# Patient Record
Sex: Female | Born: 1950 | Race: White | Hispanic: Yes | Marital: Married | Smoking: Current every day smoker
Health system: Southern US, Community
[De-identification: ages and names within clinical notes are randomized; demographics above are authoritative.]

## PROBLEM LIST (undated history)

## (undated) DIAGNOSIS — F32A Depression, unspecified: Secondary | ICD-10-CM

## (undated) DIAGNOSIS — T7840XA Allergy, unspecified, initial encounter: Secondary | ICD-10-CM

## (undated) DIAGNOSIS — K219 Gastro-esophageal reflux disease without esophagitis: Secondary | ICD-10-CM

## (undated) DIAGNOSIS — R7303 Prediabetes: Secondary | ICD-10-CM

## (undated) DIAGNOSIS — Z8619 Personal history of other infectious and parasitic diseases: Secondary | ICD-10-CM

## (undated) DIAGNOSIS — J302 Other seasonal allergic rhinitis: Secondary | ICD-10-CM

## (undated) DIAGNOSIS — IMO0001 Reserved for inherently not codable concepts without codable children: Secondary | ICD-10-CM

## (undated) DIAGNOSIS — F17200 Nicotine dependence, unspecified, uncomplicated: Secondary | ICD-10-CM

## (undated) DIAGNOSIS — I10 Essential (primary) hypertension: Secondary | ICD-10-CM

## (undated) DIAGNOSIS — B019 Varicella without complication: Secondary | ICD-10-CM

## (undated) DIAGNOSIS — F329 Major depressive disorder, single episode, unspecified: Secondary | ICD-10-CM

## (undated) DIAGNOSIS — Z8701 Personal history of pneumonia (recurrent): Secondary | ICD-10-CM

## (undated) HISTORY — PX: COLONOSCOPY: SHX174

## (undated) HISTORY — DX: Nicotine dependence, unspecified, uncomplicated: F17.200

## (undated) HISTORY — DX: Varicella without complication: B01.9

## (undated) HISTORY — DX: Other seasonal allergic rhinitis: J30.2

## (undated) HISTORY — DX: Depression, unspecified: F32.A

## (undated) HISTORY — DX: Essential (primary) hypertension: I10

## (undated) HISTORY — DX: Gastro-esophageal reflux disease without esophagitis: K21.9

## (undated) HISTORY — DX: Allergy, unspecified, initial encounter: T78.40XA

## (undated) HISTORY — DX: Personal history of other infectious and parasitic diseases: Z86.19

## (undated) HISTORY — DX: Reserved for inherently not codable concepts without codable children: IMO0001

## (undated) HISTORY — DX: Personal history of pneumonia (recurrent): Z87.01

## (undated) HISTORY — DX: Major depressive disorder, single episode, unspecified: F32.9

## (undated) HISTORY — DX: Prediabetes: R73.03

---

## 2009-05-11 DIAGNOSIS — R7303 Prediabetes: Secondary | ICD-10-CM

## 2009-05-11 HISTORY — DX: Prediabetes: R73.03

## 2009-09-05 ENCOUNTER — Ambulatory Visit: Payer: Self-pay | Admitting: Obstetrics and Gynecology

## 2009-11-29 ENCOUNTER — Ambulatory Visit: Payer: Self-pay | Admitting: Family Medicine

## 2010-01-31 LAB — COMPREHENSIVE METABOLIC PANEL
AST: 18 U/L
Albumin: 4.2
Calcium: 9.2 mg/dL
Sodium: 140 mmol/L (ref 137–147)
Total Protein: 6.7 g/dL

## 2010-01-31 LAB — HEMOGLOBIN A1C: A1c: 6.5

## 2010-01-31 LAB — LIPID PANEL
Direct LDL: 151
Triglyceride fasting, serum: 106

## 2010-01-31 LAB — VITAMIN B12: Vitamin B12 Deficiency Panel: 561

## 2010-01-31 LAB — FOLATE: Folate: 11.7

## 2010-01-31 LAB — CBC
Hemoglobin: 13.9 g/dL (ref 12.0–16.0)
WBC: 7.7

## 2010-05-12 LAB — HM PAP SMEAR: HM PAP: NORMAL

## 2011-05-12 DIAGNOSIS — Z8701 Personal history of pneumonia (recurrent): Secondary | ICD-10-CM

## 2011-05-12 HISTORY — DX: Personal history of pneumonia (recurrent): Z87.01

## 2011-08-13 ENCOUNTER — Emergency Department: Payer: Self-pay | Admitting: Emergency Medicine

## 2011-08-13 LAB — COMPREHENSIVE METABOLIC PANEL
Alkaline Phosphatase: 91 U/L (ref 50–136)
BUN: 14 mg/dL (ref 7–18)
Bilirubin,Total: 0.3 mg/dL (ref 0.2–1.0)
Calcium, Total: 8.9 mg/dL (ref 8.5–10.1)
Co2: 25 mmol/L (ref 21–32)
EGFR (African American): >60
EGFR (Non-African Amer.): >60
Osmolality: 286 (ref 275–301)
Potassium: 3.8 mmol/L (ref 3.5–5.1)
Total Protein: 7.7 g/dL (ref 6.4–8.2)

## 2011-08-13 LAB — CK TOTAL AND CKMB (NOT AT ARMC): CK-MB: 0.7 ng/mL (ref 0.5–3.6)

## 2011-08-13 LAB — CBC
HCT: 41.9 % (ref 35.0–47.0)
HGB: 14.1 g/dL (ref 12.0–16.0)
MCH: 28.1 pg (ref 26.0–34.0)
MCHC: 33.7 g/dL (ref 32.0–36.0)
MCV: 83 fL (ref 80–100)
Platelet: 250 10*3/uL (ref 150–440)
RBC: 5.03 10*6/uL (ref 3.80–5.20)

## 2011-09-16 ENCOUNTER — Encounter: Payer: Self-pay | Admitting: Family Medicine

## 2011-09-16 ENCOUNTER — Ambulatory Visit (INDEPENDENT_AMBULATORY_CARE_PROVIDER_SITE_OTHER): Payer: Managed Care, Other (non HMO) | Admitting: Family Medicine

## 2011-09-16 VITALS — BP 118/76 | HR 74 | Temp 97.3°F | Ht 64.0 in | Wt 153.0 lb

## 2011-09-16 DIAGNOSIS — Z780 Asymptomatic menopausal state: Secondary | ICD-10-CM

## 2011-09-16 DIAGNOSIS — IMO0001 Reserved for inherently not codable concepts without codable children: Secondary | ICD-10-CM

## 2011-09-16 DIAGNOSIS — F17200 Nicotine dependence, unspecified, uncomplicated: Secondary | ICD-10-CM

## 2011-09-16 DIAGNOSIS — Z23 Encounter for immunization: Secondary | ICD-10-CM

## 2011-09-16 DIAGNOSIS — E785 Hyperlipidemia, unspecified: Secondary | ICD-10-CM

## 2011-09-16 DIAGNOSIS — F17210 Nicotine dependence, cigarettes, uncomplicated: Secondary | ICD-10-CM | POA: Insufficient documentation

## 2011-09-16 DIAGNOSIS — J302 Other seasonal allergic rhinitis: Secondary | ICD-10-CM

## 2011-09-16 DIAGNOSIS — R51 Headache: Secondary | ICD-10-CM

## 2011-09-16 DIAGNOSIS — Z8701 Personal history of pneumonia (recurrent): Secondary | ICD-10-CM

## 2011-09-16 DIAGNOSIS — J309 Allergic rhinitis, unspecified: Secondary | ICD-10-CM

## 2011-09-16 DIAGNOSIS — R519 Headache, unspecified: Secondary | ICD-10-CM

## 2011-09-16 DIAGNOSIS — K219 Gastro-esophageal reflux disease without esophagitis: Secondary | ICD-10-CM

## 2011-09-16 MED ORDER — OMEPRAZOLE 40 MG PO CPDR: 40.0000 mg | DELAYED_RELEASE_CAPSULE | Freq: Every day | ORAL | Status: DC

## 2011-09-16 NOTE — Assessment & Plan Note (Signed)
Check FLP when returns fasting.  Requested records from prior PCP.

## 2011-09-16 NOTE — Progress Notes (Signed)
Subjective:    Patient ID: Robin Becker, female    DOB: 1950/11/08, 61 y.o.   MRN: [id number]  HPI CC: new pt establish  presents to establish care, looking for MD closer to home, prior saw Dr. Quillian Quince at [facility].  Have requested records from him.  On prempro for last 3-4 yrs.  Menopause age 59yo.  States takes prempro because of prior hormone imbalance that would cause episodes where didn't remember periods of the day.  Since starting this med, those sxs have resolved.  Takes prempro 2-3 x/wk.  States also had ?ablation procedure by OBGYN for thickened endometrium.  No bleeding currently.  H/o hemorrhage after stopping prempro.  No recent vision exam - rec schedule.  H/o glaucoma s/p surgery remotely.  GERD - prior on nexium, ran out 03/2011.  Finds GERD sxs returning.  Smokes a few cigarettes a day, more when drinking with friends.  Caffeine: 2 cups coffee/day Lives with husband and son and step son Occupation: homemaker Activity: wants to restart gym.  No regular activity Diet: good water, vegetables daily  Preventative: Last CPE 05/2010. Well woman - [facility] 2 yrs ago.  Had pap and states normal.   Mammogram - none recently - last was 3 yrs ago ~2010 Unsure tetanus.  Never colonoscopy.  Medications and allergies reviewed and updated in chart.  Past histories reviewed and updated if relevant as below. There is no problem list on file for this patient.  Past Medical History  Diagnosis Date  . History of chicken pox   . Depression   . Generalized headaches     frequent  . GERD (gastroesophageal reflux disease)   . Glaucoma     s/p surgery  . Seasonal allergies   . HLD (hyperlipidemia)   . Smoking    Past Surgical History  Procedure Date  . Cesarean section 1977   History  Substance Use Topics  . Smoking status: Current Everyday Smoker -- 0.3 packs/day    Types: Cigarettes  . Smokeless tobacco: Never Used  . Alcohol Use: Yes     Regular on weekends    Family History  Problem Relation Age of Onset  . Cancer Sister 73    colon and otherwise  . Stroke Maternal Aunt   . Diabetes Neg Hx   . Coronary artery disease Cousin    No Known Allergies Current Outpatient Prescriptions on File Prior to Visit  Medication Sig Dispense Refill  . estrogen, conjugated,-medroxyprogesterone (PREMPRO) 0.3-1.5 MG per tablet Take 1 tablet by mouth 2 (two) times a week.      Marland Kitchen omeprazole (PRILOSEC) 40 MG capsule Take 1 capsule (40 mg total) by mouth daily.  30 capsule  3     Review of Systems  Constitutional: Negative for fever, chills, activity change, appetite change, fatigue and unexpected weight change.  HENT: Negative for hearing loss and neck pain.   Eyes: Negative for visual disturbance.  Respiratory: Negative for cough, chest tightness, shortness of breath and wheezing.   Cardiovascular: Negative for chest pain, palpitations and leg swelling.  Gastrointestinal: Positive for abdominal pain. Negative for nausea, vomiting, diarrhea, constipation, blood in stool and abdominal distention.  Genitourinary: Negative for hematuria and difficulty urinating.  Musculoskeletal: Negative for myalgias and arthralgias.  Skin: Negative for rash.  Neurological: Positive for headaches. Negative for dizziness, seizures and syncope.  Hematological: Does not bruise/bleed easily.  Psychiatric/Behavioral: Negative for dysphoric mood. The patient is nervous/anxious.        Objective:   Physical  Exam  Nursing note and vitals reviewed. Constitutional: She is oriented to person, place, and time. She appears well-developed and well-nourished. No distress.  HENT:  Head: Normocephalic and atraumatic.  Right Ear: Hearing, tympanic membrane, external ear and ear canal normal.  Left Ear: Hearing, tympanic membrane, external ear and ear canal normal.  Nose: Nose normal.  Mouth/Throat: Oropharynx is clear and moist. No oropharyngeal exudate.  Eyes: Conjunctivae and EOM are  normal. Pupils are equal, round, and reactive to light. No scleral icterus.  Neck: Normal range of motion. Neck supple. Carotid bruit is not present. No thyromegaly present.  Cardiovascular: Normal rate, regular rhythm, normal heart sounds and intact distal pulses.   No murmur heard. Pulses:      Radial pulses are 2+ on the right side, and 2+ on the left side.  Pulmonary/Chest: Effort normal and breath sounds normal. No respiratory distress. She has no wheezes. She has no rales.  Abdominal: Soft. Bowel sounds are normal. She exhibits no distension and no mass. There is no tenderness. There is no rebound and no guarding.  Musculoskeletal: Normal range of motion. She exhibits no edema.  Lymphadenopathy:    She has no cervical adenopathy.  Neurological: She is alert and oriented to person, place, and time.       CN grossly intact, station and gait intact  Skin: Skin is warm and dry. No rash noted.  Psychiatric: She has a normal mood and affect. Her behavior is normal. Judgment and thought content normal.      Assessment & Plan:

## 2011-09-16 NOTE — Assessment & Plan Note (Signed)
On prempro low dose 2-3x/wk. Discussed concern with this, esp if h/o endometrial hyperplasia?? States knows risk of hormonal therapy and cancers.  Also discussed cardiovascular risk and risk of blood clots. Advised to schedule well woman with OBGYN and that I recommended yearly pelvic exams and for OBGYN to follow hormones. Pt states she will make appt with OBGYN.

## 2011-09-16 NOTE — Assessment & Plan Note (Signed)
Encouraged cessation , discussed this is best way of preventing URTIs and PNAs.

## 2011-09-16 NOTE — Assessment & Plan Note (Signed)
nexium worked well in past, but too expensive. Will try omeprazole at 40mg  daily - states breakthrough sxs with OTC 20mg  prilosec. Forgot to provide with samples but will have Kim call to pick up.

## 2011-09-16 NOTE — Assessment & Plan Note (Signed)
Pneumovax today, rpt due 2018 at age 61yo.

## 2011-09-16 NOTE — Patient Instructions (Addendum)
Great to meet you today - gusto conocerla hoy. Llamenos con preguntas. Omeprazole 40mg  para reflujo. Haga cita con [facility] para examen de mujer - yo recomiendo annual porque esta en hormonas. Pneumonia shot today. regrese cuando pueda en los proximos meses en ayunas para laboratorios y unos dias despues para examen fisico completo. trabaje en parar de fumar. Resource to help you quit smoking (in english and spanish) is [employer].

## 2011-10-05 ENCOUNTER — Encounter: Payer: Self-pay | Admitting: Family Medicine

## 2011-10-13 ENCOUNTER — Encounter: Payer: Self-pay | Admitting: Family Medicine

## 2011-11-03 ENCOUNTER — Other Ambulatory Visit: Payer: Self-pay | Admitting: *Deleted

## 2011-11-03 MED ORDER — OMEPRAZOLE 40 MG PO CPDR: 40.0000 mg | DELAYED_RELEASE_CAPSULE | Freq: Every day | ORAL | Status: DC

## 2012-07-13 ENCOUNTER — Ambulatory Visit: Payer: Self-pay

## 2012-07-14 ENCOUNTER — Ambulatory Visit: Payer: Self-pay

## 2013-01-17 ENCOUNTER — Ambulatory Visit: Payer: Self-pay

## 2013-05-15 LAB — HM MAMMOGRAPHY: HM MAMMO: NORMAL

## 2013-10-11 ENCOUNTER — Ambulatory Visit: Payer: Self-pay

## 2014-08-30 LAB — LIPID PANEL
Cholesterol: 250 mg/dL — AB (ref 0–200)
HDL: 63 mg/dL (ref 35–70)
LDL Cholesterol: 159 mg/dL
Triglycerides: 139 mg/dL (ref 40–160)

## 2014-08-30 LAB — BASIC METABOLIC PANEL
BUN: 12 mg/dL (ref 4–21)
Creatinine: 0.5 mg/dL (ref 0.5–1.1)
Glucose: 95 mg/dL
Potassium: 4.4 mmol/L (ref 3.4–5.3)
SODIUM: 141 mmol/L (ref 137–147)

## 2014-08-30 LAB — HEPATIC FUNCTION PANEL
ALK PHOS: 99 U/L (ref 25–125)
ALT: 19 U/L (ref 7–35)
AST: 15 U/L (ref 13–35)
BILIRUBIN, TOTAL: 0.3 mg/dL

## 2014-08-30 LAB — CBC AND DIFFERENTIAL
HCT: 43 % (ref 36–46)
Neutrophils Absolute: 4 /uL
Platelets: 304 10*3/uL (ref 150–399)
WBC: 7.7 10^3/mL

## 2014-08-30 LAB — TSH: TSH: 1.16 u[IU]/mL (ref 0.41–5.90)

## 2014-11-19 ENCOUNTER — Encounter (INDEPENDENT_AMBULATORY_CARE_PROVIDER_SITE_OTHER): Payer: Self-pay

## 2014-11-19 ENCOUNTER — Ambulatory Visit (INDEPENDENT_AMBULATORY_CARE_PROVIDER_SITE_OTHER): Payer: Medicare PPO | Admitting: Nurse Practitioner

## 2014-11-19 ENCOUNTER — Encounter: Payer: Self-pay | Admitting: Nurse Practitioner

## 2014-11-19 VITALS — BP 120/76 | HR 75 | Temp 98.1°F | Resp 14 | Ht 64.0 in | Wt 161.2 lb

## 2014-11-19 DIAGNOSIS — L819 Disorder of pigmentation, unspecified: Secondary | ICD-10-CM

## 2014-11-19 DIAGNOSIS — Z1211 Encounter for screening for malignant neoplasm of colon: Secondary | ICD-10-CM

## 2014-11-19 DIAGNOSIS — F17200 Nicotine dependence, unspecified, uncomplicated: Secondary | ICD-10-CM

## 2014-11-19 DIAGNOSIS — Z720 Tobacco use: Secondary | ICD-10-CM | POA: Diagnosis not present

## 2014-11-19 MED ORDER — TRIAMCINOLONE ACETONIDE 0.5 % EX OINT: 1.0000 "application " | TOPICAL_OINTMENT | Freq: Two times a day (BID) | CUTANEOUS | Status: DC

## 2014-11-19 NOTE — Progress Notes (Signed)
Pre visit review using our clinic review tool, if applicable. No additional management support is needed unless otherwise documented below in the visit note. 

## 2014-11-19 NOTE — Patient Instructions (Signed)
See you in 6 months! Welcome to [facility]!

## 2014-11-19 NOTE — Progress Notes (Signed)
Subjective:    Patient ID: Robin Becker, female    DOB: 08-23-1950, 64 y.o.   MRN: [id number]  HPI  Robin Becker is a 64 yo female establishing care today and CC of skin color changes and dry hands.   1) New pt info:   Immunizations- pna 2015  Mammogram- 2015 normal   Pap- 2012 normal, refuses PAP    Eye Exam- 2012   No colonoscopy- ready for one   No bone density screening- not ready  2) Chronic Problems-  Tobacco use- still smoking, ready to quit   GERD- has triggers- orange juice and tomato sauce, taking Nexium   Allergies- seasonal allergies   3) Acute Problems-  Dry skin from gloves changing husband's ostomy   Ankles- losing pigment   Review of Systems  Constitutional: Negative for fever, chills, diaphoresis and fatigue.  Respiratory: Negative for chest tightness, shortness of breath and wheezing.   Cardiovascular: Negative for chest pain, palpitations and leg swelling.  Gastrointestinal: Negative for nausea, vomiting and diarrhea.  Skin: Positive for color change. Negative for rash.  Neurological: Negative for dizziness, weakness, numbness and headaches.  Psychiatric/Behavioral: The patient is not nervous/anxious.    Past Medical History  Diagnosis Date  . History of chicken pox   . Depression   . Generalized headaches     frequent  . GERD (gastroesophageal reflux disease)   . Glaucoma     s/p surgery  . Seasonal allergies   . Smoking   . History of pneumonia 2013    [facility]  . Prediabetes 2011    h/o A1c 6.5%  . Arthritis   . Chicken pox   . Allergy     Seasonal  . Hypertension     History   Social History  . Marital Status: Married    Spouse Name: N/A  . Number of Children: N/A  . Years of Education: N/A   Occupational History  . Not on file.   Social History Main Topics  . Smoking status: Current Every Day Smoker -- 0.30 packs/day    Types: Cigarettes  . Smokeless tobacco: Never Used  . Alcohol Use: 0.0 oz/week    0 Standard drinks  or equivalent per week     Comment: Regular on weekends  . Drug Use: No  . Sexual Activity: Not on file   Other Topics Concern  . Not on file   Social History Narrative   Caffeine: 2 cups coffee/day   Lives with husband and son and step son   Occupation: homemaker   Edu:   Activity: wants to restart gym.  No regular activity   Diet: good water, vegetables daily    Past Surgical History  Procedure Laterality Date  . Cesarean section  1977    Family History  Problem Relation Age of Onset  . Cancer Sister 88    colon and otherwise  . Stroke Maternal Aunt   . Diabetes Neg Hx   . Coronary artery disease Cousin   . Heart disease Mother   . Stroke Mother     No Known Allergies  Current Outpatient Prescriptions on File Prior to Visit  Medication Sig Dispense Refill  . estrogen, conjugated,-medroxyprogesterone (PREMPRO) 0.3-1.5 MG per tablet Take 1 tablet by mouth 2 (two) times a week.    Marland Kitchen omeprazole (PRILOSEC) 40 MG capsule Take 1 capsule (40 mg total) by mouth daily. 90 capsule 3   No current facility-administered medications on file prior to visit.  Objective:   Physical Exam  Constitutional: She is oriented to person, place, and time. She appears well-developed and well-nourished. No distress.  BP 120/76 mmHg  Pulse 75  Temp(Src) 98.1 F (36.7 C)  Resp 14  Ht 5\' 4"  (1.626 m)  Wt 161 lb 3.2 oz (73.12 kg)  BMI 27.66 kg/m2  SpO2 96%   HENT:  Head: Normocephalic and atraumatic.  Right Ear: External ear normal.  Left Ear: External ear normal.  Eyes: EOM are normal. Pupils are equal, round, and reactive to light.  Neck: Normal range of motion. Neck supple. No thyromegaly present.  Cardiovascular: Normal rate, regular rhythm, normal heart sounds and intact distal pulses.  Exam reveals no gallop and no friction rub.   No murmur heard. Pulmonary/Chest: Effort normal and breath sounds normal. No respiratory distress. She has no wheezes. She has no rales. She  exhibits no tenderness.  Lymphadenopathy:    She has no cervical adenopathy.  Neurological: She is alert and oriented to person, place, and time. No cranial nerve deficit. She exhibits normal muscle tone. Coordination normal.  Skin: Skin is warm and dry. No rash noted. She is not diaphoretic.  Hypopigmented areas on ankles  Psychiatric: She has a normal mood and affect. Her behavior is normal. Judgment and thought content normal.      Assessment & Plan:

## 2014-12-02 DIAGNOSIS — L819 Disorder of pigmentation, unspecified: Secondary | ICD-10-CM | POA: Insufficient documentation

## 2014-12-02 DIAGNOSIS — Z1211 Encounter for screening for malignant neoplasm of colon: Secondary | ICD-10-CM | POA: Insufficient documentation

## 2014-12-02 NOTE — Assessment & Plan Note (Signed)
Referred to GI for colonoscopy consultation.

## 2014-12-02 NOTE — Assessment & Plan Note (Addendum)
Pt reports she has a dermatologist she will follow up with. Diff dx- vitiligo or tinea versicolor (called in kenalog to try in the mean time). Will also discuss dry hands from frequent glove changes> pt tried latex, non-latex, and different types.

## 2014-12-02 NOTE — Assessment & Plan Note (Signed)
Pt is ready to stop smoking. Her husband does smoke also despite cancer status. She reports she will try cold [place] and let me know if there are other things she wants to try at the next visit if not working. FU prn worsening/failure to improve.

## 2015-04-01 ENCOUNTER — Telehealth: Payer: Self-pay | Admitting: *Deleted

## 2015-04-01 ENCOUNTER — Other Ambulatory Visit: Payer: Self-pay

## 2015-04-01 MED ORDER — OLMESARTAN MEDOXOMIL 20 MG PO TABS: 20.0000 mg | ORAL_TABLET | Freq: Every day | ORAL | Status: DC

## 2015-04-01 NOTE — Telephone Encounter (Signed)
Patient has requested a medication refill for Robin Becker

## 2015-04-01 NOTE — Telephone Encounter (Signed)
Request completed.

## 2015-05-22 ENCOUNTER — Ambulatory Visit (INDEPENDENT_AMBULATORY_CARE_PROVIDER_SITE_OTHER): Payer: Medicare Other | Admitting: Nurse Practitioner

## 2015-05-22 ENCOUNTER — Encounter: Payer: Self-pay | Admitting: Nurse Practitioner

## 2015-05-22 VITALS — BP 118/76 | HR 85 | Temp 97.9°F | Resp 16 | Ht 64.0 in | Wt 166.2 lb

## 2015-05-22 DIAGNOSIS — Z1239 Encounter for other screening for malignant neoplasm of breast: Secondary | ICD-10-CM

## 2015-05-22 DIAGNOSIS — Z13820 Encounter for screening for osteoporosis: Secondary | ICD-10-CM | POA: Diagnosis not present

## 2015-05-22 DIAGNOSIS — Z0000 Encounter for general adult medical examination without abnormal findings: Secondary | ICD-10-CM | POA: Diagnosis not present

## 2015-05-22 MED ORDER — TRIAMCINOLONE ACETONIDE 0.5 % EX OINT: 1.0000 "application " | TOPICAL_OINTMENT | Freq: Two times a day (BID) | CUTANEOUS | Status: DC

## 2015-05-22 MED ORDER — OLMESARTAN MEDOXOMIL 20 MG PO TABS: 20.0000 mg | ORAL_TABLET | Freq: Every day | ORAL | Status: DC

## 2015-05-22 NOTE — Assessment & Plan Note (Addendum)
Discussed acute and chronic issues. Reviewed health maintenance measures, PFSHx, and immunizations. Obtain routine labs TSH, Lipid panel, CBC w/ diff, A1c, and CMET.   Getting set up for Dexa and Mammogram

## 2015-05-22 NOTE — Patient Instructions (Signed)

## 2015-05-22 NOTE — Progress Notes (Signed)
Patient ID: Robin Becker, female    DOB: 12/16/1950  Age: 65 y.o. MRN: [id number]  CC: Annual Exam   HPI Robin Becker presents for annual physical.   1) Annual Physical   Diet- Healthy diet   Exercise- No formal   Immunizations-    Flu- UTD   Tdap- UTD   Pna- pneumonia-13 in 2015 at [facility] to be set up   Bone Density-  Needs to be set up   Colonoscopy- Cologuard  Eye Exam- 6 years   Dental Exam- Not UTD  LMP- Post-menopausal   Labs- Not fasting today  Fall- Neg.   Depression- Neg.  Refills: Refills on generic benicar   History [name] has a past medical history of History of chicken pox; Depression; Generalized headaches; GERD (gastroesophageal reflux disease); Glaucoma; Seasonal allergies; Smoking; History of pneumonia (2013); Prediabetes (2011); Arthritis; Chicken pox; Allergy; and Hypertension.   She has past surgical history that includes Cesarean section (1977).   Her family history includes Cancer (age of onset: 73) in her sister; Coronary artery disease in her cousin; Heart disease in her mother; Stroke in her maternal aunt and mother. There is no history of Diabetes.She reports that she has been smoking Cigarettes.  She has been smoking about 0.30 packs per day. She has never used smokeless tobacco. She reports that she drinks alcohol. She reports that she does not use illicit drugs.  Outpatient Prescriptions Prior to Visit  Medication Sig Dispense Refill  . olmesartan (BENICAR) 20 MG tablet Take 1 tablet (20 mg total) by mouth daily. 30 tablet 4  . omeprazole (PRILOSEC) 40 MG capsule Take 1 capsule (40 mg total) by mouth daily. 90 capsule 3  . estrogen, conjugated,-medroxyprogesterone (PREMPRO) 0.3-1.5 MG per tablet Take 1 tablet by mouth 2 (two) times a week. Reported on 05/22/2015    . triamcinolone ointment (KENALOG) 0.5 % Apply 1 application topically 2 (two) times daily. (Patient not taking: Reported on 05/22/2015) 30 g 0   No  facility-administered medications prior to visit.    ROS Review of Systems  Constitutional: Negative for fever, chills, diaphoresis, fatigue and unexpected weight change.  HENT: Negative for tinnitus and trouble swallowing.   Eyes: Negative for visual disturbance.  Respiratory: Negative for chest tightness, shortness of breath and wheezing.   Cardiovascular: Negative for chest pain, palpitations and leg swelling.  Gastrointestinal: Negative for nausea, vomiting, abdominal pain, diarrhea, constipation and blood in stool.  Endocrine: Negative for polydipsia, polyphagia and polyuria.  Genitourinary: Negative for dysuria, hematuria, vaginal discharge and vaginal pain.  Musculoskeletal: Negative for myalgias, back pain, arthralgias and gait problem.  Skin: Negative for color change and rash.  Neurological: Negative for dizziness, weakness, numbness and headaches.  Hematological: Does not bruise/bleed easily.  Psychiatric/Behavioral: Negative for suicidal ideas and sleep disturbance. The patient is not nervous/anxious.     Objective:  BP 118/76 mmHg  Pulse 85  Temp(Src) 97.9 F (36.6 C) (Oral)  Resp 16  Ht 5\' 4"  (1.626 m)  Wt 166 lb 3.2 oz (75.388 kg)  BMI 28.51 kg/m2  SpO2 96%  Physical Exam  Constitutional: She is oriented to person, place, and time. She appears well-developed and well-nourished. No distress.  HENT:  Head: Normocephalic and atraumatic.  Right Ear: External ear normal.  Left Ear: External ear normal.  Nose: Nose normal.  Mouth/Throat: Oropharynx is clear and moist. No oropharyngeal exudate.  TMs and canals clear bilaterally  Eyes: Conjunctivae and EOM are normal. Pupils are equal, round, and  reactive to light. Right eye exhibits no discharge. Left eye exhibits no discharge. No scleral icterus.  Neck: Normal range of motion. Neck supple. No thyromegaly present.  Cardiovascular: Normal rate, regular rhythm, normal heart sounds and intact distal pulses.  Exam  reveals no gallop and no friction rub.   No murmur heard. Pulmonary/Chest: Effort normal and breath sounds normal. No respiratory distress. She has no wheezes. She has no rales. She exhibits no tenderness.  Clinical breast exam completed without significant findings  Abdominal: Soft. Bowel sounds are normal. She exhibits no distension and no mass. There is no tenderness. There is no rebound and no guarding.  Overweight  Genitourinary:  Deferred due to no complaints   Musculoskeletal: Normal range of motion. She exhibits no edema or tenderness.  Lymphadenopathy:    She has no cervical adenopathy.  Neurological: She is alert and oriented to person, place, and time. She has normal reflexes. No cranial nerve deficit. She exhibits normal muscle tone. Coordination normal.  Skin: Skin is warm and dry. No rash noted. She is not diaphoretic. No erythema. No pallor.  Psychiatric: She has a normal mood and affect. Her behavior is normal. Judgment and thought content normal.   Assessment & Plan:   Susy was seen today for annual exam.  Diagnoses and all orders for this visit:  Routine general medical examination at a health care facility -     CBC with Differential/Platelet; Future -     Comprehensive metabolic panel; Future -     Hemoglobin A1c; Future -     Lipid panel; Future -     TSH; Future -     VITAMIN D 25 Hydroxy (Vit-D Deficiency, Fractures); Future  Screening for breast cancer -     MM Digital Screening; Future  Screening for osteoporosis -     DG Bone Density; Future  Other orders -     olmesartan (BENICAR) 20 MG tablet; Take 1 tablet (20 mg total) by mouth daily. -     triamcinolone ointment (KENALOG) 0.5 %; Apply 1 application topically 2 (two) times daily.   I have discontinued Robin Becker estrogen (conjugated)-medroxyprogesterone. I am also having her maintain her omeprazole, olmesartan, and triamcinolone ointment.  Meds ordered this encounter  Medications  .  olmesartan (BENICAR) 20 MG tablet    Sig: Take 1 tablet (20 mg total) by mouth daily.    Dispense:  30 tablet    Refill:  4    Order Specific Question:  Supervising Provider    Answer:  Deborra Medina L [[id number]]  . triamcinolone ointment (KENALOG) 0.5 %    Sig: Apply 1 application topically 2 (two) times daily.    Dispense:  30 g    Refill:  0    Order Specific Question:  Supervising Provider    Answer:  Crecencio Mc [[id number]]     Follow-up: Return for Lab appointment for fasting labs.

## 2015-05-28 ENCOUNTER — Encounter: Payer: Self-pay | Admitting: Nurse Practitioner

## 2015-06-07 ENCOUNTER — Other Ambulatory Visit (INDEPENDENT_AMBULATORY_CARE_PROVIDER_SITE_OTHER): Payer: Medicare Other

## 2015-06-07 DIAGNOSIS — Z0000 Encounter for general adult medical examination without abnormal findings: Secondary | ICD-10-CM

## 2015-06-07 LAB — CBC WITH DIFFERENTIAL/PLATELET
BASOS ABS: 0.1 10*3/uL (ref 0.0–0.1)
Basophils Relative: 0.6 % (ref 0.0–3.0)
EOS ABS: 0.2 10*3/uL (ref 0.0–0.7)
Eosinophils Relative: 2.9 % (ref 0.0–5.0)
HCT: 41.2 % (ref 36.0–46.0)
Hemoglobin: 13.6 g/dL (ref 12.0–15.0)
LYMPHS ABS: 2.9 10*3/uL (ref 0.7–4.0)
Lymphocytes Relative: 34.7 % (ref 12.0–46.0)
MCHC: 33.2 g/dL (ref 30.0–36.0)
MCV: 83.2 fl (ref 78.0–100.0)
Monocytes Absolute: 0.6 10*3/uL (ref 0.1–1.0)
Monocytes Relative: 7 % (ref 3.0–12.0)
NEUTROS ABS: 4.5 10*3/uL (ref 1.4–7.7)
NEUTROS PCT: 54.8 % (ref 43.0–77.0)
PLATELETS: 265 10*3/uL (ref 150.0–400.0)
RBC: 4.95 Mil/uL (ref 3.87–5.11)
RDW: 14.3 % (ref 11.5–15.5)
WBC: 8.3 10*3/uL (ref 4.0–10.5)

## 2015-06-07 LAB — COMPREHENSIVE METABOLIC PANEL
ALK PHOS: 83 U/L (ref 39–117)
ALT: 13 U/L (ref 0–35)
AST: 17 U/L (ref 0–37)
Albumin: 4 g/dL (ref 3.5–5.2)
BUN: 15 mg/dL (ref 6–23)
CALCIUM: 9.3 mg/dL (ref 8.4–10.5)
CO2: 30 mEq/L (ref 19–32)
Chloride: 105 mEq/L (ref 96–112)
Creatinine, Ser: 0.69 mg/dL (ref 0.40–1.20)
GFR: 90.37 mL/min (ref >60.00)
Glucose, Bld: 107 mg/dL — ABNORMAL HIGH (ref 70–99)
POTASSIUM: 4.3 meq/L (ref 3.5–5.1)
Sodium: 138 mEq/L (ref 135–145)
Total Bilirubin: 0.4 mg/dL (ref 0.2–1.2)
Total Protein: 6.8 g/dL (ref 6.0–8.3)

## 2015-06-07 LAB — LIPID PANEL
Cholesterol: 193 mg/dL (ref 0–200)
HDL: 46.5 mg/dL (ref >39.00)
LDL Cholesterol: 122 mg/dL — ABNORMAL HIGH (ref 0–99)
NonHDL: 146.6
Total CHOL/HDL Ratio: 4
Triglycerides: 121 mg/dL (ref 0.0–149.0)
VLDL: 24.2 mg/dL (ref 0.0–40.0)

## 2015-06-07 LAB — TSH: TSH: 1.09 u[IU]/mL (ref 0.35–4.50)

## 2015-06-07 LAB — HEMOGLOBIN A1C: HEMOGLOBIN A1C: 6.2 % (ref 4.6–6.5)

## 2015-06-07 LAB — VITAMIN D 25 HYDROXY (VIT D DEFICIENCY, FRACTURES): VITD: 20.22 ng/mL — AB (ref 30.00–100.00)

## 2015-06-14 ENCOUNTER — Telehealth: Payer: Self-pay | Admitting: *Deleted

## 2015-06-14 NOTE — Telephone Encounter (Signed)
Oh lord! Sorry she walked in. Thanks for helping her find a place to go.  Robin Becker

## 2015-06-14 NOTE — Telephone Encounter (Signed)
Pt walked in to [facility], requesting to be seen for a boil in groin area, we have no appointments. I triaged pt and her vitals were stable (BP 136/82, Temp 98.2, Pulse 79, O2 97), pt advise me that she woke up yesterday with a small boil on the right side of her groin area that she tried to pop, pt woke up today and the boil has tripled in size and is red an a little swollen but it's not leaking any fluids, pt advise we have no appts. Today but I did offer her an appt at [facility] at 9:45am in [place], pt said she isn't familiar with [facility] at all and didn't want to be seen there. I advise pt if she didn't want to be seen at our [facility] she needs to go to an Urgent Care. [facility]

## 2015-06-15 ENCOUNTER — Ambulatory Visit: Payer: PRIVATE HEALTH INSURANCE | Admitting: Family Medicine

## 2015-06-17 ENCOUNTER — Encounter: Payer: Self-pay | Admitting: Nurse Practitioner

## 2015-06-17 ENCOUNTER — Ambulatory Visit (INDEPENDENT_AMBULATORY_CARE_PROVIDER_SITE_OTHER): Payer: Medicare Other | Admitting: Nurse Practitioner

## 2015-06-17 VITALS — BP 128/74 | HR 80 | Temp 98.0°F | Resp 14 | Ht 64.0 in | Wt 158.0 lb

## 2015-06-17 DIAGNOSIS — N764 Abscess of vulva: Secondary | ICD-10-CM

## 2015-06-17 MED ORDER — SULFAMETHOXAZOLE-TRIMETHOPRIM 800-160 MG PO TABS: 1.0000 | ORAL_TABLET | Freq: Two times a day (BID) | ORAL | Status: DC

## 2015-06-17 NOTE — Progress Notes (Signed)
Patient ID: Robin Becker, female    DOB: June 23, 1950  Age: 65 y.o. MRN: [id number]  CC: Mass   HPI Robin Becker presents for CC of boil in groin area.   1) patient tried to be seen at [facility] as a walk-in patient on 06/14/2015 Due to no openings at their office or our office she was told to go to urgent care Patient came to our office today as a walk-in Patient has a complaint of a abscess in the right vulvar area She reports it is draining purulent drainage and is painful She reports putting a warm washcloth on it and trying to keep it clean denies any other treatment over-the-counter Denies fever sweats or chills  History Robin Becker has a past medical history of History of chicken pox; Depression; Generalized headaches; GERD (gastroesophageal reflux disease); Glaucoma; Seasonal allergies; Smoking; History of pneumonia (2013); Prediabetes (2011); Arthritis; Chicken pox; Allergy; and Hypertension.   She has past surgical history that includes Cesarean section (1977).   Her family history includes Cancer (age of onset: 16) in her sister; Coronary artery disease in her cousin; Heart disease in her mother; Stroke in her maternal aunt and mother. There is no history of Diabetes or Breast cancer.She reports that she has been smoking Cigarettes.  She has been smoking about 0.30 packs per day. She has never used smokeless tobacco. She reports that she drinks alcohol. She reports that she does not use illicit drugs.  Outpatient Prescriptions Prior to Visit  Medication Sig Dispense Refill  . olmesartan (BENICAR) 20 MG tablet Take 1 tablet (20 mg total) by mouth daily. 30 tablet 4  . triamcinolone ointment (KENALOG) 0.5 % Apply 1 application topically 2 (two) times daily. 30 g 0  . omeprazole (PRILOSEC) 40 MG capsule Take 1 capsule (40 mg total) by mouth daily. 90 capsule 3   No facility-administered medications prior to visit.    ROS Review of Systems  Constitutional: Negative for fever,  chills, diaphoresis and fatigue.  Gastrointestinal: Negative for nausea, vomiting and diarrhea.  Skin: Positive for color change.    Objective:  BP 128/74 mmHg  Pulse 80  Temp(Src) 98 F (36.7 C) (Oral)  Resp 14  Ht 5\' 4"  (1.626 m)  Wt 158 lb (71.668 kg)  BMI 27.11 kg/m2  SpO2 96%  Physical Exam  Constitutional: She is oriented to person, place, and time. She appears well-developed and well-nourished. No distress.  HENT:  Head: Normocephalic and atraumatic.  Right Ear: External ear normal.  Left Ear: External ear normal.  Cardiovascular: Normal rate and regular rhythm.   Pulmonary/Chest: Effort normal and breath sounds normal.  Genitourinary:     Right vulva 2-3 cm fluctuant draining abscess  Purulent drainage  Neurological: She is alert and oriented to person, place, and time.  Skin: Skin is warm and dry. She is not diaphoretic.  Psychiatric: She has a normal mood and affect. Her behavior is normal. Judgment and thought content normal.     Assessment & Plan:   Sharicka was seen today for mass.  Diagnoses and all orders for this visit:  Abscess of vulva  Other orders -     sulfamethoxazole-trimethoprim (BACTRIM DS,SEPTRA DS) 800-160 MG tablet; Take 1 tablet by mouth 2 (two) times daily.   I am having Robin Becker start on sulfamethoxazole-trimethoprim. I am also having her maintain her omeprazole, olmesartan, and triamcinolone ointment.  Meds ordered this encounter  Medications  . sulfamethoxazole-trimethoprim (BACTRIM DS,SEPTRA DS) 800-160 MG tablet    Sig:  Take 1 tablet by mouth 2 (two) times daily.    Dispense:  14 tablet    Refill:  0    Order Specific Question:  Supervising Provider    Answer:  Crecencio Mc [[id number]]     Follow-up: Return if symptoms worsen or fail to improve.

## 2015-06-17 NOTE — Patient Instructions (Signed)
Warm compresses, antibiotic as prescribed.

## 2015-06-19 ENCOUNTER — Ambulatory Visit
Admission: RE | Admit: 2015-06-19 | Discharge: 2015-06-19 | Disposition: A | Discharge status: Home / Self Care | Payer: Medicare Other | Source: Ambulatory Visit | Attending: Nurse Practitioner | Admitting: Nurse Practitioner

## 2015-06-19 DIAGNOSIS — Z13820 Encounter for screening for osteoporosis: Secondary | ICD-10-CM | POA: Diagnosis not present

## 2015-06-19 DIAGNOSIS — Z1239 Encounter for other screening for malignant neoplasm of breast: Secondary | ICD-10-CM

## 2015-06-19 DIAGNOSIS — Z1231 Encounter for screening mammogram for malignant neoplasm of breast: Secondary | ICD-10-CM | POA: Diagnosis not present

## 2015-06-19 DIAGNOSIS — M810 Age-related osteoporosis without current pathological fracture: Secondary | ICD-10-CM | POA: Insufficient documentation

## 2015-06-20 ENCOUNTER — Ambulatory Visit: Payer: PRIVATE HEALTH INSURANCE | Admitting: Nurse Practitioner

## 2015-06-22 ENCOUNTER — Encounter: Payer: Self-pay | Admitting: Nurse Practitioner

## 2015-06-22 DIAGNOSIS — N764 Abscess of vulva: Secondary | ICD-10-CM | POA: Insufficient documentation

## 2015-06-22 NOTE — Assessment & Plan Note (Signed)
New-onset Currently draining vulvar abscess Semi tender to palpation Cleaned off area and press to expel some more drainage Slight amount of purulent drainage to turn to serosanguineous Bactrim DS even to patient to take 2 times daily for 7 days Probiotics recommended Asked her to keep clean, warm wet compresses multiple times daily Follow-up with me in 2 days

## 2015-06-26 DIAGNOSIS — Z1211 Encounter for screening for malignant neoplasm of colon: Secondary | ICD-10-CM | POA: Diagnosis not present

## 2015-06-26 DIAGNOSIS — Z1212 Encounter for screening for malignant neoplasm of rectum: Secondary | ICD-10-CM | POA: Diagnosis not present

## 2015-07-06 DIAGNOSIS — J019 Acute sinusitis, unspecified: Secondary | ICD-10-CM | POA: Diagnosis not present

## 2015-07-10 ENCOUNTER — Ambulatory Visit: Payer: PRIVATE HEALTH INSURANCE | Admitting: Nurse Practitioner

## 2015-07-12 ENCOUNTER — Encounter: Payer: Self-pay | Admitting: Nurse Practitioner

## 2015-07-12 ENCOUNTER — Other Ambulatory Visit: Payer: Self-pay | Admitting: Nurse Practitioner

## 2015-07-12 ENCOUNTER — Telehealth: Payer: Self-pay

## 2015-07-12 ENCOUNTER — Telehealth: Payer: Self-pay | Admitting: Nurse Practitioner

## 2015-07-12 DIAGNOSIS — Z1211 Encounter for screening for malignant neoplasm of colon: Secondary | ICD-10-CM

## 2015-07-12 LAB — COLOGUARD: Cologuard: POSITIVE

## 2015-07-12 NOTE — Telephone Encounter (Signed)
ABNORMAL RESULTS:  pt's colorguard test was positive.

## 2015-07-12 NOTE — Telephone Encounter (Signed)
Patients results from cologuard came back positive.

## 2015-07-12 NOTE — Telephone Encounter (Signed)
Patient was called and she gave phone to husband and he was explained her results. I also stated that she would be referred to a Gi doctor to have further testing.

## 2015-07-12 NOTE — Telephone Encounter (Signed)
Please call her and let her know that I have placed a referral for her to see gastroenterology

## 2015-07-15 ENCOUNTER — Encounter: Payer: Self-pay | Admitting: Gastroenterology

## 2015-07-15 ENCOUNTER — Ambulatory Visit: Payer: Medicare Other | Admitting: Nurse Practitioner

## 2015-07-15 NOTE — Telephone Encounter (Signed)
Aware and handled

## 2015-07-17 DIAGNOSIS — K5909 Other constipation: Secondary | ICD-10-CM | POA: Diagnosis not present

## 2015-07-17 DIAGNOSIS — R195 Other fecal abnormalities: Secondary | ICD-10-CM | POA: Diagnosis not present

## 2015-07-19 DIAGNOSIS — H2511 Age-related nuclear cataract, right eye: Secondary | ICD-10-CM | POA: Diagnosis not present

## 2015-07-22 ENCOUNTER — Emergency Department
Admission: EM | Admit: 2015-07-22 | Discharge: 2015-07-22 | Disposition: A | Discharge status: Home / Self Care | Payer: Medicare Other | Attending: Emergency Medicine | Admitting: Emergency Medicine

## 2015-07-22 ENCOUNTER — Emergency Department: Payer: Medicare Other

## 2015-07-22 ENCOUNTER — Encounter: Payer: Self-pay | Admitting: Emergency Medicine

## 2015-07-22 DIAGNOSIS — J209 Acute bronchitis, unspecified: Secondary | ICD-10-CM | POA: Insufficient documentation

## 2015-07-22 DIAGNOSIS — R0989 Other specified symptoms and signs involving the circulatory and respiratory systems: Secondary | ICD-10-CM | POA: Diagnosis not present

## 2015-07-22 DIAGNOSIS — Z79899 Other long term (current) drug therapy: Secondary | ICD-10-CM | POA: Diagnosis not present

## 2015-07-22 DIAGNOSIS — I10 Essential (primary) hypertension: Secondary | ICD-10-CM | POA: Insufficient documentation

## 2015-07-22 DIAGNOSIS — Z792 Long term (current) use of antibiotics: Secondary | ICD-10-CM | POA: Insufficient documentation

## 2015-07-22 DIAGNOSIS — R0981 Nasal congestion: Secondary | ICD-10-CM | POA: Diagnosis present

## 2015-07-22 DIAGNOSIS — Z7952 Long term (current) use of systemic steroids: Secondary | ICD-10-CM | POA: Insufficient documentation

## 2015-07-22 DIAGNOSIS — H748X3 Other specified disorders of middle ear and mastoid, bilateral: Secondary | ICD-10-CM | POA: Diagnosis not present

## 2015-07-22 DIAGNOSIS — F17210 Nicotine dependence, cigarettes, uncomplicated: Secondary | ICD-10-CM | POA: Diagnosis not present

## 2015-07-22 MED ORDER — PSEUDOEPH-BROMPHEN-DM 30-2-10 MG/5ML PO SYRP: 10.0000 mL | ORAL_SOLUTION | Freq: Four times a day (QID) | ORAL | Status: DC | PRN

## 2015-07-22 MED ORDER — PREDNISONE 10 MG PO TABS: ORAL_TABLET | ORAL | Status: DC

## 2015-07-22 MED ORDER — ALBUTEROL SULFATE HFA 108 (90 BASE) MCG/ACT IN AERS: 1.0000 | INHALATION_SPRAY | Freq: Four times a day (QID) | RESPIRATORY_TRACT | Status: DC | PRN

## 2015-07-22 NOTE — ED Notes (Signed)
Pt presents with congestion and sinus allergies.

## 2015-07-22 NOTE — ED Provider Notes (Signed)
[facility] Emergency Department Provider Note  ____________________________________________  Time seen: Approximately 12:16 PM  I have reviewed the triage vital signs and the nursing notes.   HISTORY  Chief Complaint Nasal Congestion    HPI Robin Becker is a 65 y.o. female , NAD, presents to the emergency department with 3 day history of cough, chest congestion, allergies. States she was seen approximately one week ago for similar symptoms a diagnosis of bronchitis. Was placed on Ceftin and a cough syrup. States she got better but now has had similar symptoms return. Cough is productive of sputum. She tried to make an appointment with her primary care provider but they had no available appointments today. Denies fever, chills, body aches. No fatigue. No chest pain or back pain.   Past Medical History  Diagnosis Date  . History of chicken pox   . Depression   . Generalized headaches     frequent  . GERD (gastroesophageal reflux disease)   . Glaucoma     s/p surgery  . Seasonal allergies   . Smoking   . History of pneumonia 2013    [facility]  . Prediabetes 2011    h/o A1c 6.5%  . Arthritis   . Chicken pox   . Allergy     Seasonal  . Hypertension     Patient Active Problem List   Diagnosis Date Noted  . Abscess of vulva 06/22/2015  . Routine general medical examination at a health care facility 05/22/2015  . Special screening for malignant neoplasms, colon 12/02/2014  . Hypopigmented skin lesion 12/02/2014  . Postmenopausal 09/16/2011  . Generalized headaches   . GERD (gastroesophageal reflux disease)   . Seasonal allergies   . HLD (hyperlipidemia)   . Smoking     Past Surgical History  Procedure Laterality Date  . Cesarean section  1977    Current Outpatient Rx  Name  Route  Sig  Dispense  Refill  . albuterol (PROVENTIL HFA;VENTOLIN HFA) 108 (90 Base) MCG/ACT inhaler   Inhalation   Inhale 1-2 puffs into the lungs every 6 (six) hours  as needed for wheezing or shortness of breath.   1 Inhaler   0   . brompheniramine-pseudoephedrine-DM 30-2-10 MG/5ML syrup   Oral   Take 10 mLs by mouth 4 (four) times daily as needed.   200 mL   0   . olmesartan (BENICAR) 20 MG tablet   Oral   Take 1 tablet (20 mg total) by mouth daily.   30 tablet   4   . EXPIRED: omeprazole (PRILOSEC) 40 MG capsule   Oral   Take 1 capsule (40 mg total) by mouth daily.   90 capsule   3   . predniSONE (DELTASONE) 10 MG tablet      Take a daily regimen of 6,5,4,3,2,1   21 tablet   0   . sulfamethoxazole-trimethoprim (BACTRIM DS,SEPTRA DS) 800-160 MG tablet   Oral   Take 1 tablet by mouth 2 (two) times daily.   14 tablet   0   . triamcinolone ointment (KENALOG) 0.5 %   Topical   Apply 1 application topically 2 (two) times daily.   30 g   0     Allergies Review of patient's allergies indicates no known allergies.  Family History  Problem Relation Age of Onset  . Cancer Sister 16    colon and otherwise  . Stroke Maternal Aunt   . Diabetes Neg Hx   . Breast cancer Neg  Hx   . Coronary artery disease Cousin   . Heart disease Mother   . Stroke Mother     Social History Social History  Substance Use Topics  . Smoking status: Current Every Day Smoker -- 0.30 packs/day    Types: Cigarettes  . Smokeless tobacco: Never Used  . Alcohol Use: 0.0 oz/week    0 Standard drinks or equivalent per week     Comment: Regular on weekends     Review of Systems  Constitutional: No fever/chills Eyes: No visual changes. No discharge ENT: As noted nasal congestion, runny nose, itchy ears and nose. No sore throat or ear pain. Cardiovascular: No chest pain. Respiratory: As of chest congestion, productive cough. No shortness of breath. No wheezing.  Gastrointestinal: No abdominal pain.  No nausea, vomiting.  No diarrhea.   Musculoskeletal: Negative for back pain.  Skin: Negative for rash. Neurological: Negative for headaches, focal  weakness or numbness. 10-point ROS otherwise negative.  ____________________________________________   PHYSICAL EXAM:  VITAL SIGNS: ED Triage Vitals  Enc Vitals Group     BP 07/22/15 1123 138/76 mmHg     Pulse Rate 07/22/15 1123 88     Resp 07/22/15 1123 18     Temp 07/22/15 1123 98.3 F (36.8 C)     Temp Source 07/22/15 1123 Oral     SpO2 07/22/15 1123 94 %     Weight 07/22/15 1123 157 lb (71.215 kg)     Height 07/22/15 1123 5\' 4"  (1.626 m)     Head Cir --      Peak Flow --      Pain Score --      Pain Loc --      Pain Edu? --      Excl. in [place]? --     Constitutional: Alert and oriented. Well appearing and in no acute distress. Eyes: Conjunctivae are normal. PERRL. EOMI without pain.  Head: Atraumatic. ENT:      Ears: Times visualized bilaterally with mild serous effusion but no bulging or erythema.      Nose: No congestion/rhinnorhea.      Mouth/Throat: Mucous membranes are moist. Nares without erythema or swelling. No exudates. Trace clear postnasal drip. Neck: No stridor. Supple with full range of motion Hematological/Lymphatic/Immunilogical: No cervical lymphadenopathy. Cardiovascular: Normal rate, regular rhythm. Normal S1 and S2.  Good peripheral circulation. Respiratory: Normal respiratory effort without tachypnea or retractions. Mild wheeze noted throughout lung fields but no rhonchi or rales. Neurologic:  Normal speech and language. No gross focal neurologic deficits are appreciated.  Skin:  Skin is warm, dry and intact. No rash noted. Psychiatric: Mood and affect are normal. Speech and behavior are normal. Patient exhibits appropriate insight and judgement.   ____________________________________________   LABS (all labs ordered are listed, but only abnormal results are displayed)  Labs Reviewed - No data to display ____________________________________________  EKG  None ____________________________________________  RADIOLOGY I have personally viewed  and evaluated these images (plain radiographs) as part of my medical decision making, as well as reviewing the written report by the radiologist.  Dg Chest 2 View  07/22/2015  CLINICAL DATA:  Congestion and sinus allergies 3 days. Current smoker. EXAM: CHEST  2 VIEW COMPARISON:  08/13/2011 and chest CT 08/13/2011 FINDINGS: Lungs are somewhat hypoinflated without consolidation or effusion. Cardiomediastinal silhouette is within normal. Subtle spondylosis of the spine. Possible mild anterior wedging of mid to upper thoracic vertebral body. IMPRESSION: No active cardiopulmonary disease. Electronically Signed   By: Quillian Quince  Derrel Nip M.D.   On: 07/22/2015 13:46    ____________________________________________    PROCEDURES  Procedure(s) performed: None      Medications - No data to display   ____________________________________________   INITIAL IMPRESSION / ASSESSMENT AND PLAN / ED COURSE  Pertinent imaging results that were available during my care of the patient were reviewed by me and considered in my medical decision making (see chart for details).  Patient's diagnosis is consistent with bronchospasm with bronchitis. Anticipate the patient has seasonal allergies that are uncontrolled. Patient will be discharged home with prescriptions for albuterol inhaler, Bromfed-DM syrup, prednisone tablets to take as directed. Patient is to follow up with her primary care provider in 2-3 days for recheck of this emergency department visit. Patient is given ED precautions to return to the ED for any worsening or new symptoms.    ____________________________________________  FINAL CLINICAL IMPRESSION(S) / ED DIAGNOSES  Final diagnoses:  Bronchospasm with bronchitis, acute      NEW MEDICATIONS STARTED DURING THIS VISIT:  New Prescriptions   ALBUTEROL (PROVENTIL HFA;VENTOLIN HFA) 108 (90 BASE) MCG/ACT INHALER    Inhale 1-2 puffs into the lungs every 6 (six) hours as needed for wheezing or  shortness of breath.   BROMPHENIRAMINE-PSEUDOEPHEDRINE-DM 30-2-10 MG/5ML SYRUP    Take 10 mLs by mouth 4 (four) times daily as needed.   PREDNISONE (DELTASONE) 10 MG TABLET    Take a daily regimen of 6,5,4,3,2,1         Braxton Feathers, PA-C 07/22/15 1401  Lavonia Drafts, MD 07/22/15 1439

## 2015-07-22 NOTE — Discharge Instructions (Signed)
Bronchospasm, Adult A bronchospasm is when the tubes that carry air in and out of your lungs (airways) spasm or tighten. During a bronchospasm it is hard to breathe. This is because the airways get smaller. A bronchospasm can be triggered by:  Allergies. These may be to animals, pollen, food, or mold.  Infection. This is a common cause of bronchospasm.  Exercise.  Irritants. These include pollution, cigarette smoke, strong odors, aerosol sprays, and paint fumes.  Weather changes.  Stress.  Being emotional. HOME CARE   Always have a plan for getting help. Know when to call your doctor and local emergency services (911 in the [place] Know where you can get emergency care.  Only take medicines as told by your doctor.  If you were prescribed an inhaler or nebulizer machine, ask your doctor how to use it correctly. Always use a spacer with your inhaler if you were given one.  Stay calm during an attack. Try to relax and breathe more slowly.  Control your home environment:  Change your heating and air conditioning filter at least once a month.  Limit your use of fireplaces and wood stoves.  Do not  smoke. Do not  allow smoking in your home.  Avoid perfumes and fragrances.  Get rid of pests (such as roaches and mice) and their droppings.  Throw away plants if you see mold on them.  Keep your house clean and dust free.  Replace carpet with wood, tile, or vinyl flooring. Carpet can trap dander and dust.  Use allergy-proof pillows, mattress covers, and box spring covers.  Wash bed sheets and blankets every week in hot water. Dry them in a dryer.  Use blankets that are made of polyester or cotton.  Wash hands frequently. GET HELP IF:  You have muscle aches.  You have chest pain.  The thick spit you spit or cough up (sputum) changes from clear or white to yellow, green, gray, or bloody.  The thick spit you spit or cough up gets thicker.  There are problems that may be  related to the medicine you are given such as:  A rash.  Itching.  Swelling.  Trouble breathing. GET HELP RIGHT AWAY IF:  You feel you cannot breathe or catch your breath.  You cannot stop coughing.  Your treatment is not helping you breathe better.  You have very bad chest pain. MAKE SURE YOU:   Understand these instructions.  Will watch your condition.  Will get help right away if you are not doing well or get worse.   This information is not intended to replace advice given to you by your health care provider. Make sure you discuss any questions you have with your health care provider.   Document Released: 02/22/2009 Document Revised: 05/18/2014 Document Reviewed: 10/18/2012 Elsevier Interactive Patient Education 2016 [employer].  

## 2015-07-25 DIAGNOSIS — K644 Residual hemorrhoidal skin tags: Secondary | ICD-10-CM | POA: Diagnosis not present

## 2015-07-25 DIAGNOSIS — D126 Benign neoplasm of colon, unspecified: Secondary | ICD-10-CM | POA: Diagnosis not present

## 2015-07-25 DIAGNOSIS — R195 Other fecal abnormalities: Secondary | ICD-10-CM | POA: Diagnosis not present

## 2015-07-25 DIAGNOSIS — D123 Benign neoplasm of transverse colon: Secondary | ICD-10-CM | POA: Diagnosis not present

## 2015-07-25 DIAGNOSIS — Z800 Family history of malignant neoplasm of digestive organs: Secondary | ICD-10-CM | POA: Diagnosis not present

## 2015-07-25 DIAGNOSIS — K635 Polyp of colon: Secondary | ICD-10-CM | POA: Diagnosis not present

## 2015-07-25 DIAGNOSIS — K640 First degree hemorrhoids: Secondary | ICD-10-CM | POA: Diagnosis not present

## 2015-07-25 DIAGNOSIS — K648 Other hemorrhoids: Secondary | ICD-10-CM | POA: Diagnosis not present

## 2015-08-07 DIAGNOSIS — H2511 Age-related nuclear cataract, right eye: Secondary | ICD-10-CM | POA: Diagnosis not present

## 2015-08-15 ENCOUNTER — Ambulatory Visit: Payer: Medicare Other | Admitting: Gastroenterology

## 2015-08-20 ENCOUNTER — Encounter: Payer: Self-pay | Admitting: *Deleted

## 2015-08-27 ENCOUNTER — Encounter: Payer: Self-pay | Admitting: *Deleted

## 2015-08-27 ENCOUNTER — Encounter
Admission: RE | Disposition: A | Discharge status: Home / Self Care | Payer: Self-pay | Source: Ambulatory Visit | Attending: Ophthalmology

## 2015-08-27 ENCOUNTER — Ambulatory Visit: Payer: Medicare Other | Admitting: Anesthesiology

## 2015-08-27 ENCOUNTER — Ambulatory Visit
Admission: RE | Admit: 2015-08-27 | Discharge: 2015-08-27 | Disposition: A | Discharge status: Home / Self Care | Payer: Medicare Other | Source: Ambulatory Visit | Attending: Ophthalmology | Admitting: Ophthalmology

## 2015-08-27 DIAGNOSIS — K219 Gastro-esophageal reflux disease without esophagitis: Secondary | ICD-10-CM | POA: Insufficient documentation

## 2015-08-27 DIAGNOSIS — H2511 Age-related nuclear cataract, right eye: Secondary | ICD-10-CM | POA: Insufficient documentation

## 2015-08-27 DIAGNOSIS — I10 Essential (primary) hypertension: Secondary | ICD-10-CM | POA: Diagnosis not present

## 2015-08-27 DIAGNOSIS — H409 Unspecified glaucoma: Secondary | ICD-10-CM | POA: Insufficient documentation

## 2015-08-27 DIAGNOSIS — F329 Major depressive disorder, single episode, unspecified: Secondary | ICD-10-CM | POA: Insufficient documentation

## 2015-08-27 DIAGNOSIS — J40 Bronchitis, not specified as acute or chronic: Secondary | ICD-10-CM | POA: Diagnosis not present

## 2015-08-27 DIAGNOSIS — F17200 Nicotine dependence, unspecified, uncomplicated: Secondary | ICD-10-CM | POA: Insufficient documentation

## 2015-08-27 DIAGNOSIS — M810 Age-related osteoporosis without current pathological fracture: Secondary | ICD-10-CM | POA: Diagnosis not present

## 2015-08-27 DIAGNOSIS — Z79899 Other long term (current) drug therapy: Secondary | ICD-10-CM | POA: Insufficient documentation

## 2015-08-27 HISTORY — PX: CATARACT EXTRACTION W/PHACO: SHX586

## 2015-08-27 SURGERY — PHACOEMULSIFICATION, CATARACT, WITH IOL INSERTION
Anesthesia: Monitor Anesthesia Care | Site: Eye | Laterality: Right | Wound class: Clean

## 2015-08-27 MED ORDER — FENTANYL CITRATE (PF) 100 MCG/2ML IJ SOLN
INTRAMUSCULAR | Status: DC | PRN
  Administered 2015-08-27: 50 ug via INTRAVENOUS

## 2015-08-27 MED ORDER — EPINEPHRINE HCL 1 MG/ML IJ SOLN
INTRAMUSCULAR | Status: DC | PRN
  Administered 2015-08-27: 1 mL via OPHTHALMIC

## 2015-08-27 MED ORDER — CEFUROXIME OPHTHALMIC INJECTION 1 MG/0.1 ML
INJECTION | OPHTHALMIC | Status: AC
  Filled 2015-08-27: qty 0.1

## 2015-08-27 MED ORDER — NA CHONDROIT SULF-NA HYALURON 40-17 MG/ML IO SOLN
INTRAOCULAR | Status: AC
  Filled 2015-08-27: qty 1

## 2015-08-27 MED ORDER — MOXIFLOXACIN HCL 0.5 % OP SOLN: 1.0000 [drp] | OPHTHALMIC | Status: DC | PRN

## 2015-08-27 MED ORDER — NA CHONDROIT SULF-NA HYALURON 40-17 MG/ML IO SOLN
INTRAOCULAR | Status: DC | PRN
  Administered 2015-08-27: 1 mL via INTRAOCULAR

## 2015-08-27 MED ORDER — LIDOCAINE HCL (PF) 4 % IJ SOLN
INTRAMUSCULAR | Status: AC
  Filled 2015-08-27: qty 5

## 2015-08-27 MED ORDER — LIDOCAINE HCL (PF) 4 % IJ SOLN
INTRAOCULAR | Status: DC | PRN
  Administered 2015-08-27: .5 mL via OPHTHALMIC

## 2015-08-27 MED ORDER — CARBACHOL 0.01 % IO SOLN
INTRAOCULAR | Status: DC | PRN
  Administered 2015-08-27: .5 mL via INTRAOCULAR

## 2015-08-27 MED ORDER — TETRACAINE HCL 0.5 % OP SOLN
1.0000 [drp] | Freq: Once | OPHTHALMIC | Status: AC
  Administered 2015-08-27: 1 [drp] via OPHTHALMIC

## 2015-08-27 MED ORDER — POVIDONE-IODINE 5 % OP SOLN
1.0000 "application " | Freq: Once | OPHTHALMIC | Status: AC
  Administered 2015-08-27: 1 via OPHTHALMIC

## 2015-08-27 MED ORDER — MOXIFLOXACIN HCL 0.5 % OP SOLN
OPHTHALMIC | Status: DC | PRN
  Administered 2015-08-27: 1 [drp] via OPHTHALMIC

## 2015-08-27 MED ORDER — MIDAZOLAM HCL 2 MG/2ML IJ SOLN
INTRAMUSCULAR | Status: DC | PRN
  Administered 2015-08-27 (×2): 1 mg via INTRAVENOUS

## 2015-08-27 MED ORDER — ARMC OPHTHALMIC DILATING GEL
1.0000 "application " | OPHTHALMIC | Status: DC | PRN
  Administered 2015-08-27 (×2): 1 via OPHTHALMIC

## 2015-08-27 MED ORDER — EPINEPHRINE HCL 1 MG/ML IJ SOLN
INTRAMUSCULAR | Status: AC
  Filled 2015-08-27: qty 2

## 2015-08-27 MED ORDER — SODIUM CHLORIDE 0.9 % IV SOLN
INTRAVENOUS | Status: DC
  Administered 2015-08-27: 09:00:00 via INTRAVENOUS

## 2015-08-27 MED ORDER — CEFUROXIME OPHTHALMIC INJECTION 1 MG/0.1 ML
INJECTION | OPHTHALMIC | Status: DC | PRN
Start: 2015-08-27 — End: 2015-08-27
  Administered 2015-08-27: .1 mL via INTRACAMERAL

## 2015-08-27 SURGICAL SUPPLY — 22 items
CANNULA ANT/CHMB 27GA (MISCELLANEOUS) ×3 IMPLANT
CUP MEDICINE 2OZ PLAST GRAD ST (MISCELLANEOUS) ×3 IMPLANT
GLOVE BIO SURGEON STRL SZ8 (GLOVE) ×3 IMPLANT
GLOVE BIOGEL M 6.5 STRL (GLOVE) ×3 IMPLANT
GLOVE SURG LX 8.0 MICRO (GLOVE) ×2
GLOVE SURG LX STRL 8.0 MICRO (GLOVE) ×1 IMPLANT
GOWN STRL REUS W/ TWL LRG LVL3 (GOWN DISPOSABLE) ×2 IMPLANT
GOWN STRL REUS W/TWL LRG LVL3 (GOWN DISPOSABLE) ×4
LENS IOL TECNIS 24.0 (Intraocular Lens) ×3 IMPLANT
LENS IOL TECNIS MONO 1P 24.0 (Intraocular Lens) ×1 IMPLANT
PACK CATARACT (MISCELLANEOUS) ×3 IMPLANT
PACK CATARACT BRASINGTON LX (MISCELLANEOUS) ×3 IMPLANT
PACK EYE AFTER SURG (MISCELLANEOUS) ×3 IMPLANT
SOL BSS BAG (MISCELLANEOUS) ×3
SOL PREP PVP 2OZ (MISCELLANEOUS) ×3
SOLUTION BSS BAG (MISCELLANEOUS) ×1 IMPLANT
SOLUTION PREP PVP 2OZ (MISCELLANEOUS) ×1 IMPLANT
SYR 3ML LL SCALE MARK (SYRINGE) ×3 IMPLANT
SYR 5ML LL (SYRINGE) ×3 IMPLANT
SYR TB 1ML 27GX1/2 LL (SYRINGE) ×3 IMPLANT
WATER STERILE IRR 1000ML POUR (IV SOLUTION) ×3 IMPLANT
WIPE NON LINTING 3.25X3.25 (MISCELLANEOUS) ×3 IMPLANT

## 2015-08-27 NOTE — Transfer of Care (Signed)
Immediate Anesthesia Transfer of Care Note  Patient: Robin Becker  Procedure(s) Performed: Procedure(s) with comments: CATARACT EXTRACTION PHACO AND INTRAOCULAR LENS PLACEMENT (IOC) (Right) - [place] 00:53 AP% 20.1 CDE 10.80 fluid pack lot # [id number]  Patient Location: PACU  Anesthesia Type:MAC  Level of Consciousness: awake, alert  and oriented  Airway & Oxygen Therapy: Patient Spontanous Breathing  Post-op Assessment: Report given to RN and Post -op Vital signs reviewed and stable  Post vital signs: Reviewed and stable  Last Vitals:  Filed Vitals:   08/27/15 1043 08/27/15 1044  BP:  120/57  Pulse: 66 64  Temp: 36.6 C   Resp: 18     Complications: No apparent anesthesia complications

## 2015-08-27 NOTE — Discharge Instructions (Signed)
AMBULATORY SURGERY  DISCHARGE INSTRUCTIONS   1) The drugs that you were given will stay in your system until tomorrow so for the next 24 hours you should not:  A) Drive an automobile B) Make any legal decisions C) Drink any alcoholic beverage   2) You may resume regular meals tomorrow.  Today it is better to start with liquids and gradually work up to solid foods.  You may eat anything you prefer, but it is better to start with liquids, then soup and crackers, and gradually work up to solid foods.   3) Please notify your doctor immediately if you have any unusual bleeding, trouble breathing, redness and pain at the surgery site, drainage, fever, or pain not relieved by medication.    4) Additional Instructions:    Eye Surgery Discharge Instructions  Expect mild scratchy sensation or mild soreness. DO NOT RUB YOUR EYE!  The day of surgery:  Minimal physical activity, but bed rest is not required  No reading, computer work, or close hand work  No bending, lifting, or straining.  May watch TV  For 24 hours:  No driving, legal decisions, or alcoholic beverages  Safety precautions  Eat anything you prefer: It is better to start with liquids, then soup then solid foods.  _____ Eye patch should be worn until postoperative exam tomorrow.  ____ Solar shield eyeglasses should be worn for comfort in the sunlight/patch while sleeping  Resume all regular medications including aspirin or Coumadin if these were discontinued prior to surgery. You may shower, bathe, shave, or wash your hair. Tylenol may be taken for mild discomfort.  Call your doctor if you experience significant pain, nausea, or vomiting, fever > 101 or other signs of infection. [phone] or [phone] Specific instructions:  Follow-up Information    Follow up with PORFILIO,WILLIAM LOUIS, MD In 1 day.   Specialty:  Ophthalmology   Why:  April 19 at 10:10am   Contact information:   [place] [address] [phone]         Please contact your physician with any problems or Same Day Surgery at [phone], Monday through Friday 6 am to 4 pm, or [place] at [facility] number at [phone].

## 2015-08-27 NOTE — Anesthesia Postprocedure Evaluation (Signed)
Anesthesia Post Note  Patient: Robin Becker  Procedure(s) Performed: Procedure(s) (LRB): CATARACT EXTRACTION PHACO AND INTRAOCULAR LENS PLACEMENT (IOC) (Right)  Patient location during evaluation: PACU Anesthesia Type: MAC Level of consciousness: patient remains intubated per anesthesia plan, awake, oriented and awake and alert Pain management: pain level controlled Vital Signs Assessment: post-procedure vital signs reviewed and stable Respiratory status: spontaneous breathing, nonlabored ventilation and respiratory function stable Cardiovascular status: stable Anesthetic complications: no    Last Vitals:  Filed Vitals:   08/27/15 1043 08/27/15 1044  BP: 120/57 120/57  Pulse: 66 64  Temp: 36.6 C   Resp: 18     Last Pain: There were no vitals filed for this visit.               [employer]

## 2015-08-27 NOTE — Op Note (Signed)
PREOPERATIVE DIAGNOSIS:  Nuclear sclerotic cataract of the right eye.   POSTOPERATIVE DIAGNOSIS: nuclear sclerotic cataract right eye   OPERATIVE PROCEDURE:  Procedure(s): CATARACT EXTRACTION PHACO AND INTRAOCULAR LENS PLACEMENT (IOC)   SURGEON:  Birder Robson, MD.   ANESTHESIA:  Anesthesiologist: Martha Clan, MD CRNA: Lance Muss, CRNA  1.      Managed anesthesia care. 2.      Topical tetracaine drops followed by 2% Xylocaine jelly applied in the preoperative holding area.   COMPLICATIONS:  None.   TECHNIQUE:   Stop and chop   DESCRIPTION OF PROCEDURE:  The patient was examined and consented in the preoperative holding area where the aforementioned topical anesthesia was applied to the right eye and then brought back to the Operating Room where the right eye was prepped and draped in the usual sterile ophthalmic fashion and a lid speculum was placed. A paracentesis was created with the side port blade and the anterior chamber was filled with viscoelastic. A near clear corneal incision was performed with the steel keratome. A continuous curvilinear capsulorrhexis was performed with a cystotome followed by the capsulorrhexis forceps. Hydrodissection and hydrodelineation were carried out with BSS on a blunt cannula. The lens was removed in a stop and chop  technique and the remaining cortical material was removed with the irrigation-aspiration handpiece. The capsular bag was inflated with viscoelastic and the Technis [id number]  lens was placed in the capsular bag without complication. The remaining viscoelastic was removed from the eye with the irrigation-aspiration handpiece. The wounds were hydrated. The anterior chamber was flushed with Miostat and the eye was inflated to physiologic pressure. 0.1 mL of cefuroxime concentration 10 mg/mL was placed in the anterior chamber. The wounds were found to be water tight. The eye was dressed with Vigamox. The patient was given protective glasses to wear  throughout the day and a shield with which to sleep tonight. The patient was also given drops with which to begin a drop regimen today and will follow-up with me in one day.  Implant Name Type Inv. Item Serial No. Manufacturer Lot No. LRB No. Used  LENS IOL TECNIS 24.0 - [id number] Intraocular Lens LENS IOL TECNIS 24.0 [id number] AMO   Right 1   Procedure(s) with comments: CATARACT EXTRACTION PHACO AND INTRAOCULAR LENS PLACEMENT (IOC) (Right) - [place] 00:53 AP% 20.1 CDE 10.80 fluid pack lot # [id number]  Electronically signed: [place] 08/27/2015 10:43 AM

## 2015-08-27 NOTE — Anesthesia Preprocedure Evaluation (Signed)
Anesthesia Evaluation  Patient identified by MRN, date of birth, ID band Patient awake    Reviewed: Allergy & Precautions, H&P , NPO status , Patient's Chart, lab work & pertinent test results, reviewed documented beta blocker date and time   History of Anesthesia Complications Negative for: history of anesthetic complications  Airway Mallampati: I  TM Distance: >3 FB Neck ROM: full    Dental no notable dental hx. (+) Caps, Missing   Pulmonary neg shortness of breath, neg sleep apnea, neg COPD, Recent URI  (bronchitis last month), Resolved, Current Smoker,    Pulmonary exam normal breath sounds clear to auscultation       Cardiovascular Exercise Tolerance: Good hypertension, On Medications (-) angina(-) CAD, (-) Past MI, (-) Cardiac Stents and (-) CABG Normal cardiovascular exam(-) dysrhythmias (-) Valvular Problems/Murmurs Rhythm:regular Rate:Normal     Neuro/Psych PSYCHIATRIC DISORDERS (Depression) negative neurological ROS     GI/Hepatic Neg liver ROS, GERD  ,  Endo/Other  diabetes (Borderline)  Renal/GU negative Renal ROS  negative genitourinary   Musculoskeletal   Abdominal   Peds  Hematology negative hematology ROS (+)   Anesthesia Other Findings Past Medical History:   History of chicken pox                                       Depression                                                   GERD (gastroesophageal reflux disease)                       Glaucoma                                                       Comment:s/p surgery   Seasonal allergies                                           Smoking                                                      History of pneumonia                            2013           Comment:[facility]   Prediabetes                                     2011           Comment:h/o A1c 6.5%   Chicken pox  Allergy                                                         Comment:Seasonal   Hypertension                                                 Reproductive/Obstetrics negative OB ROS                             Anesthesia Physical Anesthesia Plan  ASA: II  Anesthesia Plan: MAC   Post-op Pain Management:    Induction:   Airway Management Planned:   Additional Equipment:   Intra-op Plan:   Post-operative Plan:   Informed Consent: I have reviewed the patients History and Physical, chart, labs and discussed the procedure including the risks, benefits and alternatives for the proposed anesthesia with the patient or authorized representative who has indicated his/her understanding and acceptance.   Dental Advisory Given  Plan Discussed with: Anesthesiologist, CRNA and Surgeon  Anesthesia Plan Comments:         Anesthesia Quick Evaluation

## 2015-08-27 NOTE — H&P (Signed)
  All labs reviewed. Abnormal studies sent to patients PCP when indicated.  Previous H&P reviewed, patient examined, there are NO CHANGES.  Robin Becker LOUIS4/18/201710:15 AM

## 2015-10-02 ENCOUNTER — Encounter: Payer: Self-pay | Admitting: Ophthalmology

## 2015-12-02 ENCOUNTER — Other Ambulatory Visit: Payer: Self-pay | Admitting: Nurse Practitioner

## 2015-12-11 ENCOUNTER — Encounter (INDEPENDENT_AMBULATORY_CARE_PROVIDER_SITE_OTHER): Payer: Self-pay

## 2015-12-11 ENCOUNTER — Ambulatory Visit: Payer: Medicare Other | Admitting: Family Medicine

## 2015-12-12 ENCOUNTER — Emergency Department
Admission: EM | Admit: 2015-12-12 | Discharge: 2015-12-12 | Disposition: A | Discharge status: Home / Self Care | Payer: Medicare Other | Attending: Emergency Medicine | Admitting: Emergency Medicine

## 2015-12-12 ENCOUNTER — Emergency Department: Payer: Medicare Other

## 2015-12-12 ENCOUNTER — Encounter: Payer: Self-pay | Admitting: Emergency Medicine

## 2015-12-12 DIAGNOSIS — K8050 Calculus of bile duct without cholangitis or cholecystitis without obstruction: Secondary | ICD-10-CM | POA: Diagnosis not present

## 2015-12-12 DIAGNOSIS — K8020 Calculus of gallbladder without cholecystitis without obstruction: Secondary | ICD-10-CM | POA: Diagnosis not present

## 2015-12-12 DIAGNOSIS — F17210 Nicotine dependence, cigarettes, uncomplicated: Secondary | ICD-10-CM | POA: Diagnosis not present

## 2015-12-12 DIAGNOSIS — I10 Essential (primary) hypertension: Secondary | ICD-10-CM | POA: Insufficient documentation

## 2015-12-12 DIAGNOSIS — R1011 Right upper quadrant pain: Secondary | ICD-10-CM | POA: Diagnosis present

## 2015-12-12 LAB — COMPREHENSIVE METABOLIC PANEL
ALK PHOS: 80 U/L (ref 38–126)
ALT: 16 U/L (ref 14–54)
ANION GAP: 6 (ref 5–15)
AST: 19 U/L (ref 15–41)
Albumin: 4.1 g/dL (ref 3.5–5.0)
BILIRUBIN TOTAL: 0.6 mg/dL (ref 0.3–1.2)
BUN: 11 mg/dL (ref 6–20)
CO2: 27 mmol/L (ref 22–32)
Calcium: 9.4 mg/dL (ref 8.9–10.3)
Chloride: 106 mmol/L (ref 101–111)
Creatinine, Ser: 0.52 mg/dL (ref 0.44–1.00)
GLUCOSE: 94 mg/dL (ref 65–99)
POTASSIUM: 3.9 mmol/L (ref 3.5–5.1)
Sodium: 139 mmol/L (ref 135–145)
TOTAL PROTEIN: 7.2 g/dL (ref 6.5–8.1)

## 2015-12-12 LAB — URINALYSIS COMPLETE WITH MICROSCOPIC (ARMC ONLY)
BACTERIA UA: NONE SEEN
Bilirubin Urine: NEGATIVE
GLUCOSE, UA: NEGATIVE mg/dL
HGB URINE DIPSTICK: NEGATIVE
Ketones, ur: NEGATIVE mg/dL
Leukocytes, UA: NEGATIVE
NITRITE: NEGATIVE
PROTEIN: NEGATIVE mg/dL
RBC / HPF: NONE SEEN RBC/hpf (ref 0–5)
SPECIFIC GRAVITY, URINE: 1.003 — AB (ref 1.005–1.030)
Squamous Epithelial / LPF: NONE SEEN
WBC UA: NONE SEEN WBC/hpf (ref 0–5)
pH: 7 (ref 5.0–8.0)

## 2015-12-12 LAB — CBC
HEMATOCRIT: 41.6 % (ref 35.0–47.0)
HEMOGLOBIN: 14.2 g/dL (ref 12.0–16.0)
MCH: 28.3 pg (ref 26.0–34.0)
MCHC: 34.1 g/dL (ref 32.0–36.0)
MCV: 82.9 fL (ref 80.0–100.0)
Platelets: 236 10*3/uL (ref 150–440)
RBC: 5.02 MIL/uL (ref 3.80–5.20)
RDW: 14 % (ref 11.5–14.5)
WBC: 8.2 10*3/uL (ref 3.6–11.0)

## 2015-12-12 LAB — LIPASE, BLOOD: Lipase: 26 U/L (ref 11–51)

## 2015-12-12 MED ORDER — OXYCODONE-ACETAMINOPHEN 5-325 MG PO TABS: 2.0000 | ORAL_TABLET | Freq: Four times a day (QID) | ORAL | 0 refills | Status: DC | PRN

## 2015-12-12 MED ORDER — ONDANSETRON HCL 4 MG PO TABS: 4.0000 mg | ORAL_TABLET | Freq: Every day | ORAL | 1 refills | Status: DC | PRN

## 2015-12-12 NOTE — ED Provider Notes (Signed)
[facility] Emergency Department Provider Note        Time seen: ----------------------------------------- 2:12 PM on 12/12/2015 -----------------------------------------    I have reviewed the triage vital signs and the nursing notes.   HISTORY  Chief Complaint Abdominal Pain    HPI Robin Becker is a 65 y.o. female who presents the ER with right upper quadrant pain that radiates into her back. She describes it as a soreness, nothing makes it better or worse. She's had a history of this before but never this bad.She has had some nausea but no vomiting. Pain is intermittent. She denies fevers or other complaints.   Past Medical History:  Diagnosis Date  . Allergy    Seasonal  . Chicken pox   . Depression   . GERD (gastroesophageal reflux disease)   . Glaucoma    s/p surgery  . History of chicken pox   . History of pneumonia 2013   [facility]  . Hypertension   . Prediabetes 2011   h/o A1c 6.5%  . Seasonal allergies   . Smoking     Patient Active Problem List   Diagnosis Date Noted  . Abscess of vulva 06/22/2015  . Routine general medical examination at a health care facility 05/22/2015  . Special screening for malignant neoplasms, colon 12/02/2014  . Hypopigmented skin lesion 12/02/2014  . Postmenopausal 09/16/2011  . Generalized headaches   . GERD (gastroesophageal reflux disease)   . Seasonal allergies   . HLD (hyperlipidemia)   . Smoking     Past Surgical History:  Procedure Laterality Date  . CATARACT EXTRACTION W/PHACO Right 08/27/2015   Procedure: CATARACT EXTRACTION PHACO AND INTRAOCULAR LENS PLACEMENT (IOC);  Surgeon: Birder Robson, MD;  Location: [facility] ORS;  Service: Ophthalmology;  Laterality: Right;  [place] 00:53 AP% 20.1 CDE 10.80 fluid pack lot # [id number] [place]  . COLONOSCOPY      Allergies Review of patient's allergies indicates no known allergies.  Social History Social History  Substance Use  Topics  . Smoking status: Current Every Day Smoker    Packs/day: 0.25    Types: Cigarettes  . Smokeless tobacco: Never Used  . Alcohol use 0.0 oz/week     Comment: Regular on weekends    Review of Systems Constitutional: Negative for fever. Cardiovascular: Negative for chest pain. Respiratory: Negative for shortness of breath. Gastrointestinal: Positive for abdominal pain, nausea Genitourinary: Positive for right-sided back pain Musculoskeletal: Negative for back pain. Skin: Negative for rash. Neurological: Negative for headaches, focal weakness or numbness.  10-point ROS otherwise negative.  ____________________________________________   PHYSICAL EXAM:  VITAL SIGNS: ED Triage Vitals [12/12/15 1200]  Enc Vitals Group     BP (!) 154/77     Pulse Rate 76     Resp 16     Temp 98.4 F (36.9 C)     Temp Source Oral     SpO2 99 %     Weight 157 lb (71.2 kg)     Height 5\' 4"  (1.626 m)     Head Circumference      Peak Flow      Pain Score 1     Pain Loc      Pain Edu?      Excl. in [place]?     Constitutional: Alert and oriented. Well appearing and in no distress. Eyes: Conjunctivae are normal. PERRL. Normal extraocular movements. ENT   Head: Normocephalic and atraumatic.   Nose: No congestion/rhinnorhea.   Mouth/Throat:  Mucous membranes are moist.   Neck: No stridor. Cardiovascular: Normal rate, regular rhythm. No murmurs, rubs, or gallops. Respiratory: Normal respiratory effort without tachypnea nor retractions. Breath sounds are clear and equal bilaterally. No wheezes/rales/rhonchi. Gastrointestinal: Mild right upper quadrant tenderness, no rebound or guarding. Normal bowel sounds. Musculoskeletal: Nontender with normal range of motion in all extremities. No lower extremity tenderness nor edema. Neurologic:  Normal speech and language. No gross focal neurologic deficits are appreciated.  Skin:  Skin is warm, dry and intact. No rash noted. Psychiatric: Mood  and affect are normal. Speech and behavior are normal.  ____________________________________________  ED COURSE:  Pertinent labs & imaging results that were available during my care of the patient were reviewed by me and considered in my medical decision making (see chart for details). Clinical Course  Patient is in no acute distress, will check basic labs, consider gallbladder ultrasound.  Procedures ____________________________________________   LABS (pertinent positives/negatives)  Labs Reviewed  URINALYSIS COMPLETEWITH MICROSCOPIC ([facility] ONLY) - Abnormal; Notable for the following:       Result Value   Color, Urine STRAW (*)    APPearance CLEAR (*)    Specific Gravity, Urine 1.003 (*)    All other components within normal limits  LIPASE, BLOOD  COMPREHENSIVE METABOLIC PANEL  CBC    RADIOLOGY  Gallbladder ultrasound IMPRESSION: Two large mobile gallstones measuring up to 2 cm. There may be smaller gallstones or gallbladder sludge. No gallbladder wall thickening or pericholecystic fluid. No tenderness during scanning per ultrasound technologist.  Top-normal size intrahepatic biliary ducts.  ____________________________________________  FINAL ASSESSMENT AND PLAN  Biliary colic  Plan: Patient with labs and imaging as dictated above. Patient is in no acute distress, I will refer her to general surgery for outpatient follow-up. Clinically she does not have cholecystitis. She is stable for discharge.   Earleen Newport, MD   Note: This dictation was prepared with Dragon dictation. Any transcriptional errors that result from this process are unintentional    Earleen Newport, MD 12/12/15 [phone]

## 2015-12-12 NOTE — ED Triage Notes (Signed)
C/O RUQ abdominal pain that radiates to right upper back x 3 days.  Pain accompanied by nausea.  Pain is intermittent

## 2015-12-18 DIAGNOSIS — E119 Type 2 diabetes mellitus without complications: Secondary | ICD-10-CM | POA: Insufficient documentation

## 2015-12-18 DIAGNOSIS — M1990 Unspecified osteoarthritis, unspecified site: Secondary | ICD-10-CM | POA: Insufficient documentation

## 2015-12-18 DIAGNOSIS — I10 Essential (primary) hypertension: Secondary | ICD-10-CM | POA: Insufficient documentation

## 2015-12-18 DIAGNOSIS — R7303 Prediabetes: Secondary | ICD-10-CM | POA: Insufficient documentation

## 2015-12-20 ENCOUNTER — Encounter: Payer: Self-pay | Admitting: General Surgery

## 2015-12-20 ENCOUNTER — Ambulatory Visit (INDEPENDENT_AMBULATORY_CARE_PROVIDER_SITE_OTHER): Payer: Medicare Other | Admitting: General Surgery

## 2015-12-20 VITALS — BP 150/71 | HR 77 | Temp 97.8°F | Ht 64.0 in | Wt 158.0 lb

## 2015-12-20 DIAGNOSIS — G8929 Other chronic pain: Secondary | ICD-10-CM | POA: Diagnosis not present

## 2015-12-20 DIAGNOSIS — R1010 Upper abdominal pain, unspecified: Secondary | ICD-10-CM | POA: Diagnosis not present

## 2015-12-20 DIAGNOSIS — R1011 Right upper quadrant pain: Principal | ICD-10-CM

## 2015-12-20 NOTE — Progress Notes (Signed)
Patient ID: Robin Becker, female   DOB: 03-27-1951, 65 y.o.   MRN: [id number]  CC: RUQ PAIN  HPI Robin Becker is a 65 y.o. female who presents to clinic for evaluation of right upper quadrant pain. Patient was seen in the emergency department a week ago for her right upper quadrant pain. Patient states she went to the ER because she doesn't have a primary care provider. She has been having right upper quadrant pain for greater than 10 years. The pain was bad enough to where she complained of someone told her to go to the ER. In the ER she was found to have gallstones and was treated with a GI cocktail and felt better. Patient reports she's been taking Nexium on and off for the last 10 years with never takes more than 1 or 2 a week. The Nexium does help her symptoms when they occur. She states there are no specific food triggers for her abdominal pain and that anything can make it worse. She denies any fevers, chills, nausea, vomiting, chest pain, shortness breath, diarrhea, constipation. She does state that at the worse of the pain a week ago she had a little nausea but no vomiting.  HPI  Past Medical History:  Diagnosis Date  . Allergy    Seasonal  . Chicken pox   . Depression   . GERD (gastroesophageal reflux disease)   . Glaucoma    s/p surgery  . History of chicken pox   . History of pneumonia 2013   [facility]  . Hypertension   . Prediabetes 2011   h/o A1c 6.5%  . Seasonal allergies   . Smoking     Past Surgical History:  Procedure Laterality Date  . CATARACT EXTRACTION W/PHACO Right 08/27/2015   Procedure: CATARACT EXTRACTION PHACO AND INTRAOCULAR LENS PLACEMENT (IOC);  Surgeon: Birder Robson, MD;  Location: [facility] ORS;  Service: Ophthalmology;  Laterality: Right;  [place] 00:53 AP% 20.1 CDE 10.80 fluid pack lot # [id number] [place]  . COLONOSCOPY      Family History  Problem Relation Age of Onset  . Heart disease Mother   . Stroke Mother   . Cancer Sister 31     colon and otherwise  . Stroke Maternal Aunt   . Coronary artery disease Cousin   . Diabetes Neg Hx   . Breast cancer Neg Hx     Social History Social History  Substance Use Topics  . Smoking status: Current Every Day Smoker    Packs/day: 0.25    Types: Cigarettes  . Smokeless tobacco: Never Used  . Alcohol use 0.0 oz/week     Comment: Regular on weekends    No Known Allergies  Current Outpatient Prescriptions  Medication Sig Dispense Refill  . Cholecalciferol (VITAMIN D-3 PO) Take by mouth.    . esomeprazole (NEXIUM) 20 MG packet Take 20 mg by mouth as needed.    . loratadine (CLARITIN) 10 MG tablet Take 10 mg by mouth daily.    Marland Kitchen olmesartan (BENICAR) 20 MG tablet Take 1 tablet (20 mg total) by mouth daily. 30 tablet 4   No current facility-administered medications for this visit.      Review of Systems A Multi-point review of systems was asked and was negative except for the findings documented in the history of present illness   Physical Exam Blood pressure (!) 150/71, pulse 77, temperature 97.8 F (36.6 C), temperature source Oral, height 5\' 4"  (1.626 m), weight 71.7 kg (158  lb). CONSTITUTIONAL: No acute distress. EYES: Pupils are equal, round, and reactive to light, Sclera are non-icteric. EARS, NOSE, MOUTH AND THROAT: The oropharynx is clear. The oral mucosa is pink and moist. Hearing is intact to voice. LYMPH NODES:  Lymph nodes in the neck are normal. RESPIRATORY:  Lungs are clear. There is normal respiratory effort, with equal breath sounds bilaterally, and without pathologic use of accessory muscles. CARDIOVASCULAR: Heart is regular without murmurs, gallops, or rubs. GI: The abdomen is soft, nontender, and nondistended. There are no palpable masses. There is no hepatosplenomegaly. There are normal bowel sounds in all quadrants. Well-healed, wide scar from low midline C-section. GU: Rectal deferred.   MUSCULOSKELETAL: Normal muscle strength and tone. No  cyanosis or edema.   SKIN: Turgor is good and there are no pathologic skin lesions or ulcers. NEUROLOGIC: Motor and sensation is grossly normal. Cranial nerves are grossly intact. PSYCH:  Oriented to person, place and time. Affect is normal.  Data Reviewed Images and labs reviewed with all labs within normal limits and ultrasound showing gallstones but no evidence of pericholecystic fluid, gallbladder wall thickening, common duct dilatation. I have personally reviewed the patient's imaging, laboratory findings and medical records.    Assessment    Chronic right upper quadrant abdominal pain    Plan    65 year old female with chronic right upper quadrant abdominal pain that is not associated with eating. She states the past this is always improved with Nexium. Given that her history is not completely consistent with biliary colic discussed with her the option that it may actually be related to gastritis versus reflux. Discussed at length about the signs and symptoms of biliary colic. Patient is unsure she's ever had these. Given her ultrasound findings however said that her pain could be from gallstones. Given the uncertainty of the diagnosis discussed either getting a GI referral or starting her on a daily treatment of Nexium. Patient desires to do daily treatment Nexium discussed taking it every day at the same time before she eats. If her pain improves with this over the next 2 weeks there is no need for follow-up surgery. If it does not improve despite appropriate, maximal medical therapy that would be another indication this could be from biliary colic and she will make an appointment come back for a repeat evaluation to discuss removing her gallbladder. All questions were answered to the patient's satisfaction and she'll follow up on an as-needed basis.     Time spent with the patient was 45 minutes, with more than 50% of the time spent in face-to-face education, counseling and care  coordination.     Clayburn Pert, MD FACS General Surgeon 12/20/2015, 11:46 AM

## 2015-12-20 NOTE — Patient Instructions (Signed)
Please take your Nexium 40mg  Once Daily for the next 2 weeks. If you are still having these symptoms on 01/03/16; Call our office and we will place you back on Dr. Reginal Lutes schedule to re-evaluate your symptoms.

## 2016-01-30 ENCOUNTER — Other Ambulatory Visit: Payer: Self-pay | Admitting: Nurse Practitioner

## 2016-01-30 DIAGNOSIS — K219 Gastro-esophageal reflux disease without esophagitis: Secondary | ICD-10-CM | POA: Diagnosis not present

## 2016-01-30 DIAGNOSIS — R5383 Other fatigue: Secondary | ICD-10-CM | POA: Diagnosis not present

## 2016-01-30 DIAGNOSIS — R030 Elevated blood-pressure reading, without diagnosis of hypertension: Secondary | ICD-10-CM | POA: Diagnosis not present

## 2016-01-30 DIAGNOSIS — Z23 Encounter for immunization: Secondary | ICD-10-CM | POA: Diagnosis not present

## 2016-01-31 NOTE — Telephone Encounter (Signed)
Spoke with patient to schedule appointment. She stated that she will be going to another provider. I advised that we will not be able to fill her RX and she will need to get in with her new PCP soon.

## 2016-03-23 ENCOUNTER — Other Ambulatory Visit: Payer: Self-pay | Admitting: Nurse Practitioner

## 2016-03-23 NOTE — Telephone Encounter (Signed)
Does the patient need a refill before then? If she does not you can refuse it. If she does need a refill please let me know.

## 2016-03-23 NOTE — Telephone Encounter (Signed)
Patient stated that she did not need it

## 2016-03-23 NOTE — Telephone Encounter (Signed)
I have spoken with patient and she stated that she has an appointment with another office to establish care next week. Is it ok to decline medication?

## 2016-03-30 DIAGNOSIS — Z0000 Encounter for general adult medical examination without abnormal findings: Secondary | ICD-10-CM | POA: Diagnosis not present

## 2016-03-30 DIAGNOSIS — Z789 Other specified health status: Secondary | ICD-10-CM | POA: Diagnosis not present

## 2016-04-21 DIAGNOSIS — J069 Acute upper respiratory infection, unspecified: Secondary | ICD-10-CM | POA: Diagnosis not present

## 2016-05-29 ENCOUNTER — Emergency Department
Admission: EM | Admit: 2016-05-29 | Discharge: 2016-05-29 | Disposition: A | Discharge status: Home / Self Care | Payer: Medicare Other | Attending: Emergency Medicine | Admitting: Emergency Medicine

## 2016-05-29 ENCOUNTER — Emergency Department: Payer: Medicare Other

## 2016-05-29 ENCOUNTER — Encounter: Payer: Self-pay | Admitting: Emergency Medicine

## 2016-05-29 DIAGNOSIS — J9801 Acute bronchospasm: Secondary | ICD-10-CM

## 2016-05-29 DIAGNOSIS — R05 Cough: Secondary | ICD-10-CM | POA: Diagnosis not present

## 2016-05-29 DIAGNOSIS — F17210 Nicotine dependence, cigarettes, uncomplicated: Secondary | ICD-10-CM | POA: Insufficient documentation

## 2016-05-29 DIAGNOSIS — J209 Acute bronchitis, unspecified: Secondary | ICD-10-CM

## 2016-05-29 DIAGNOSIS — I10 Essential (primary) hypertension: Secondary | ICD-10-CM | POA: Diagnosis not present

## 2016-05-29 DIAGNOSIS — Z79899 Other long term (current) drug therapy: Secondary | ICD-10-CM | POA: Insufficient documentation

## 2016-05-29 MED ORDER — METHYLPREDNISOLONE SODIUM SUCC 125 MG IJ SOLR
80.0000 mg | Freq: Once | INTRAMUSCULAR | Status: AC
  Administered 2016-05-29: 80 mg via INTRAMUSCULAR
  Filled 2016-05-29: qty 2

## 2016-05-29 MED ORDER — IPRATROPIUM-ALBUTEROL 0.5-2.5 (3) MG/3ML IN SOLN
3.0000 mL | Freq: Once | RESPIRATORY_TRACT | Status: AC
  Administered 2016-05-29: 3 mL via RESPIRATORY_TRACT
  Filled 2016-05-29: qty 3

## 2016-05-29 MED ORDER — PROMETHAZINE-DM 6.25-15 MG/5ML PO SYRP: 5.0000 mL | ORAL_SOLUTION | Freq: Four times a day (QID) | ORAL | 0 refills | Status: DC | PRN

## 2016-05-29 MED ORDER — PREDNISONE 20 MG PO TABS: ORAL_TABLET | ORAL | 0 refills | Status: DC

## 2016-05-29 MED ORDER — IPRATROPIUM-ALBUTEROL 0.5-2.5 (3) MG/3ML IN SOLN: 3.0000 mL | Freq: Four times a day (QID) | RESPIRATORY_TRACT | 0 refills | Status: DC | PRN

## 2016-05-29 MED ORDER — AZITHROMYCIN 250 MG PO TABS: ORAL_TABLET | ORAL | 0 refills | Status: DC

## 2016-05-29 NOTE — ED Provider Notes (Signed)
[facility] Emergency Department Provider Note  ____________________________________________  Time seen: Approximately 1:43 PM  I have reviewed the triage vital signs and the nursing notes.   HISTORY  Chief Complaint Cough    HPI Robin Becker is a 66 y.o. female , NAD, presents to emergency department with 1 month history of cough, chest congestion and wheezing. Patient states she has had cough and chest congestion over the last month. States pain worsening over the last week and is now associated with shortness of breath and wheezing. She has had production of thick green sputum at times. Denies any fevers, chills or body aches. Has had no chest pain, abdominal pain, nausea, vomiting or diarrhea. Denies nasal congestion, runny nose, ear pain, sinus pressure nor sore throat. Has had no sick contacts. Does have a history of asthma in which she is has been using a nebulizer at home which she feels has not been effective.    Past Medical History:  Diagnosis Date  . Allergy    Seasonal  . Chicken pox   . Depression   . GERD (gastroesophageal reflux disease)   . Glaucoma    s/p surgery  . History of chicken pox   . History of pneumonia 2013   [facility]  . Hypertension   . Prediabetes 2011   h/o A1c 6.5%  . Seasonal allergies   . Smoking     Patient Active Problem List   Diagnosis Date Noted  . Hypertension 12/18/2015  . OA (osteoarthritis) 12/18/2015  . Prediabetes 12/18/2015  . Abscess of vulva 06/22/2015  . Routine general medical examination at a health care facility 05/22/2015  . Special screening for malignant neoplasms, colon 12/02/2014  . Hypopigmented skin lesion 12/02/2014  . Postmenopausal 09/16/2011  . Generalized headaches   . GERD (gastroesophageal reflux disease)   . Seasonal allergies   . HLD (hyperlipidemia)   . Smoking     Past Surgical History:  Procedure Laterality Date  . CATARACT EXTRACTION W/PHACO Right 08/27/2015   Procedure: CATARACT EXTRACTION PHACO AND INTRAOCULAR LENS PLACEMENT (IOC);  Surgeon: Birder Robson, MD;  Location: [facility] ORS;  Service: Ophthalmology;  Laterality: Right;  [place] 00:53 AP% 20.1 CDE 10.80 fluid pack lot # [id number] [place]  . COLONOSCOPY      Prior to Admission medications   Medication Sig Start Date End Date Taking? Authorizing Provider  azithromycin (ZITHROMAX Z-PAK) 250 MG tablet Take 2 tablets (500 mg) on  Day 1,  followed by 1 tablet (250 mg) once daily on Days 2 through 5. 05/29/16   Derisha Funderburke L Elery Cadenhead, PA-C  Cholecalciferol (VITAMIN D-3 PO) Take by mouth.    Historical Provider, MD  esomeprazole (NEXIUM) 20 MG packet Take 20 mg by mouth as needed.    Historical Provider, MD  ipratropium-albuterol (DUONEB) 0.5-2.5 (3) MG/3ML SOLN Take 3 mLs by nebulization every 6 (six) hours as needed. 05/29/16   Brea Coleson L Alick Lecomte, PA-C  loratadine (CLARITIN) 10 MG tablet Take 10 mg by mouth daily.    Historical Provider, MD  olmesartan (BENICAR) 20 MG tablet Take 1 tablet (20 mg total) by mouth daily. 05/22/15   Rubbie Battiest, NP  predniSONE (DELTASONE) 20 MG tablet Take 2 tablets by mouth, once daily, for 5 days 05/29/16   Toula Miyasaki L Kavi Almquist, PA-C  promethazine-dextromethorphan (PROMETHAZINE-DM) 6.25-15 MG/5ML syrup Take 5 mLs by mouth 4 (four) times daily as needed for cough. 05/29/16   Iolani Twilley L Patriciaann Rabanal, PA-C    Allergies Patient  has no known allergies.  Family History  Problem Relation Age of Onset  . Heart disease Mother   . Stroke Mother   . Cancer Sister 32    colon and otherwise  . Stroke Maternal Aunt   . Coronary artery disease Cousin   . Diabetes Neg Hx   . Breast cancer Neg Hx     Social History Social History  Substance Use Topics  . Smoking status: Current Every Day Smoker    Packs/day: 0.25    Types: Cigarettes  . Smokeless tobacco: Never Used  . Alcohol use 0.0 oz/week     Comment: Regular on weekends     Review of Systems  Constitutional: No  fever/chills, Fatigue Eyes: No visual changes.  ENT: No sore throat, Nasal congestion, runny nose, sinus pressure, ear pain. Cardiovascular: No chest pain. Respiratory: Positive productive cough with chest congestion and wheezing. No shortness of breath. Gastrointestinal: No abdominal pain.  No nausea, vomiting.  No diarrhea.  Musculoskeletal: Negative for back pain, general myalgias.  Skin: Negative for rash. Neurological: Negative for headaches, focal weakness or numbness. 10-point ROS otherwise negative.  ____________________________________________   PHYSICAL EXAM:  VITAL SIGNS: ED Triage Vitals  Enc Vitals Group     BP 05/29/16 1153 (!) 147/70     Pulse Rate 05/29/16 1153 82     Resp 05/29/16 1153 17     Temp 05/29/16 1153 98.2 F (36.8 C)     Temp Source 05/29/16 1153 Oral     SpO2 05/29/16 1153 98 %     Weight --      Height --      Head Circumference --      Peak Flow --      Pain Score 05/29/16 1154 2     Pain Loc --      Pain Edu? --      Excl. in [place]? --      Constitutional: Alert and oriented. Well appearing and in no acute distress. Eyes: Conjunctivae are normal.  Head: Atraumatic. ENT:      Ears: TMs visualized bilaterally without erythema, effusion, bulging, perforation.      Nose: No congestion/rhinnorhea.      Mouth/Throat: Mucous membranes are moist. Pharynx without erythema, swelling, exudate. Uvula is midline. Airway is patent. Neck: No stridor. Supple with full range of motion. Hematological/Lymphatic/Immunilogical: No cervical lymphadenopathy. Cardiovascular: Normal rate, regular rhythm. Normal S1 and S2.  Good peripheral circulation. Respiratory: Normal respiratory effort without tachypnea or retractions. Lungs with diffuse wheezes and rhonchi bilaterally. No rales. Breath sounds heard in all lung fields. Musculoskeletal: No lower extremity tenderness nor edema.  No joint effusions. Neurologic:  Normal speech and language. No gross focal  neurologic deficits are appreciated.  Skin:  Skin is warm, dry and intact. No rash noted. Psychiatric: Mood and affect are normal. Speech and behavior are normal. Patient exhibits appropriate insight and judgement.   ____________________________________________   LABS  None ____________________________________________  EKG  None ____________________________________________  RADIOLOGY I, Braxton Feathers, personally viewed and evaluated these images (plain radiographs) as part of my medical decision making, as well as reviewing the written report by the radiologist.  Dg Chest 2 View  Result Date: 05/29/2016 CLINICAL DATA:  Productive cough for 2 months EXAM: CHEST  2 VIEW COMPARISON:  07/22/2015 FINDINGS: Normal heart size. Lungs clear. No pneumothorax. No pleural effusion. IMPRESSION: No active cardiopulmonary disease. Electronically Signed   By: Marybelle Killings M.D.   On: 05/29/2016 14:09  ____________________________________________    PROCEDURES  Procedure(s) performed: None   Procedures   Medications  ipratropium-albuterol (DUONEB) 0.5-2.5 (3) MG/3ML nebulizer solution 3 mL (3 mLs Nebulization Given 05/29/16 1410)  methylPREDNISolone sodium succinate (SOLU-MEDROL) 125 mg/2 mL injection 80 mg (80 mg Intramuscular Given 05/29/16 1411)     ____________________________________________   INITIAL IMPRESSION / ASSESSMENT AND PLAN / ED COURSE  Pertinent labs & imaging results that were available during my care of the patient were reviewed by me and considered in my medical decision making (see chart for details).     Patient's diagnosis is consistent with Acute bronchitis with acute bronchospasm. Patient had significant improvement of cough and wheezing after administration of DuoNeb nebulized solution. She was given IM Solu-Medrol while in the emergency department and tolerated well without side effects. Patient will be discharged home with prescriptions for azithromycin,  DuoNeb nebulized solution, prednisone and promethazine DM to take as directed. Patient is to follow up with her primary care provider if symptoms persist past this treatment course. Patient is given ED precautions to return to the ED for any worsening or new symptoms.    ____________________________________________  FINAL CLINICAL IMPRESSION(S) / ED DIAGNOSES  Final diagnoses:  Acute bronchitis, unspecified organism  Acute bronchospasm      NEW MEDICATIONS STARTED DURING THIS VISIT:  Discharge Medication List as of 05/29/2016  2:38 PM    START taking these medications   Details  azithromycin (ZITHROMAX Z-PAK) 250 MG tablet Take 2 tablets (500 mg) on  Day 1,  followed by 1 tablet (250 mg) once daily on Days 2 through 5., Print    ipratropium-albuterol (DUONEB) 0.5-2.5 (3) MG/3ML SOLN Take 3 mLs by nebulization every 6 (six) hours as needed., Starting Fri 05/29/2016, Print    predniSONE (DELTASONE) 20 MG tablet Take 2 tablets by mouth, once daily, for 5 days, Print    promethazine-dextromethorphan (PROMETHAZINE-DM) 6.25-15 MG/5ML syrup Take 5 mLs by mouth 4 (four) times daily as needed for cough., Starting Fri 05/29/2016, Print             Judithe Modest [place], PA-C 05/29/16 1841    Delman Kitten, MD 05/30/16 2337

## 2016-05-29 NOTE — ED Triage Notes (Signed)
Pt reports cough x1 month. Pt reports sputum is green/clear.

## 2016-05-29 NOTE — ED Notes (Signed)
See triage note  States she developed a prod cough about 3-4 weeks ago  Afebrile on arrival

## 2016-06-02 IMAGING — CR DG CHEST 2V
2 series · 2 of 2 positions shown · non-contrast
Comparison: 08/13/2011 and chest CT 08/13/2011

CLINICAL DATA: Congestion and sinus allergies 3 days. Current
smoker.

EXAM:
CHEST  2 VIEW

[chest pa]
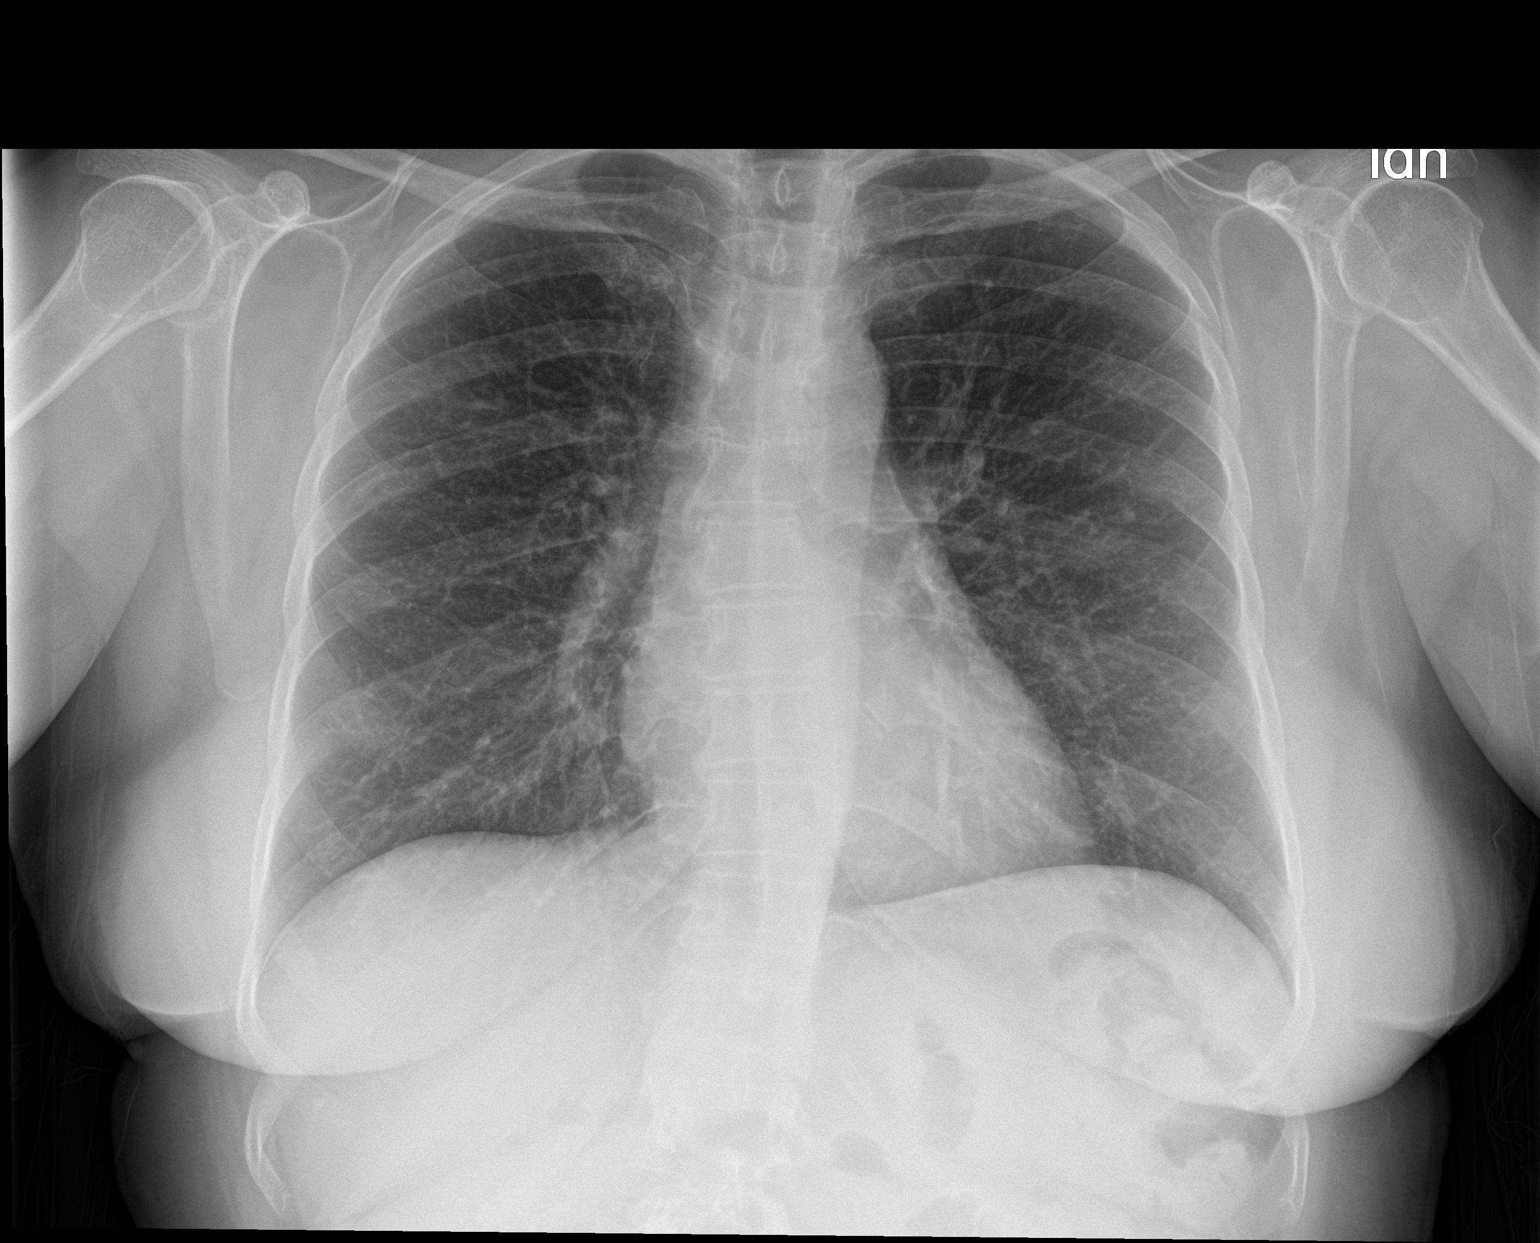

[chest lat]
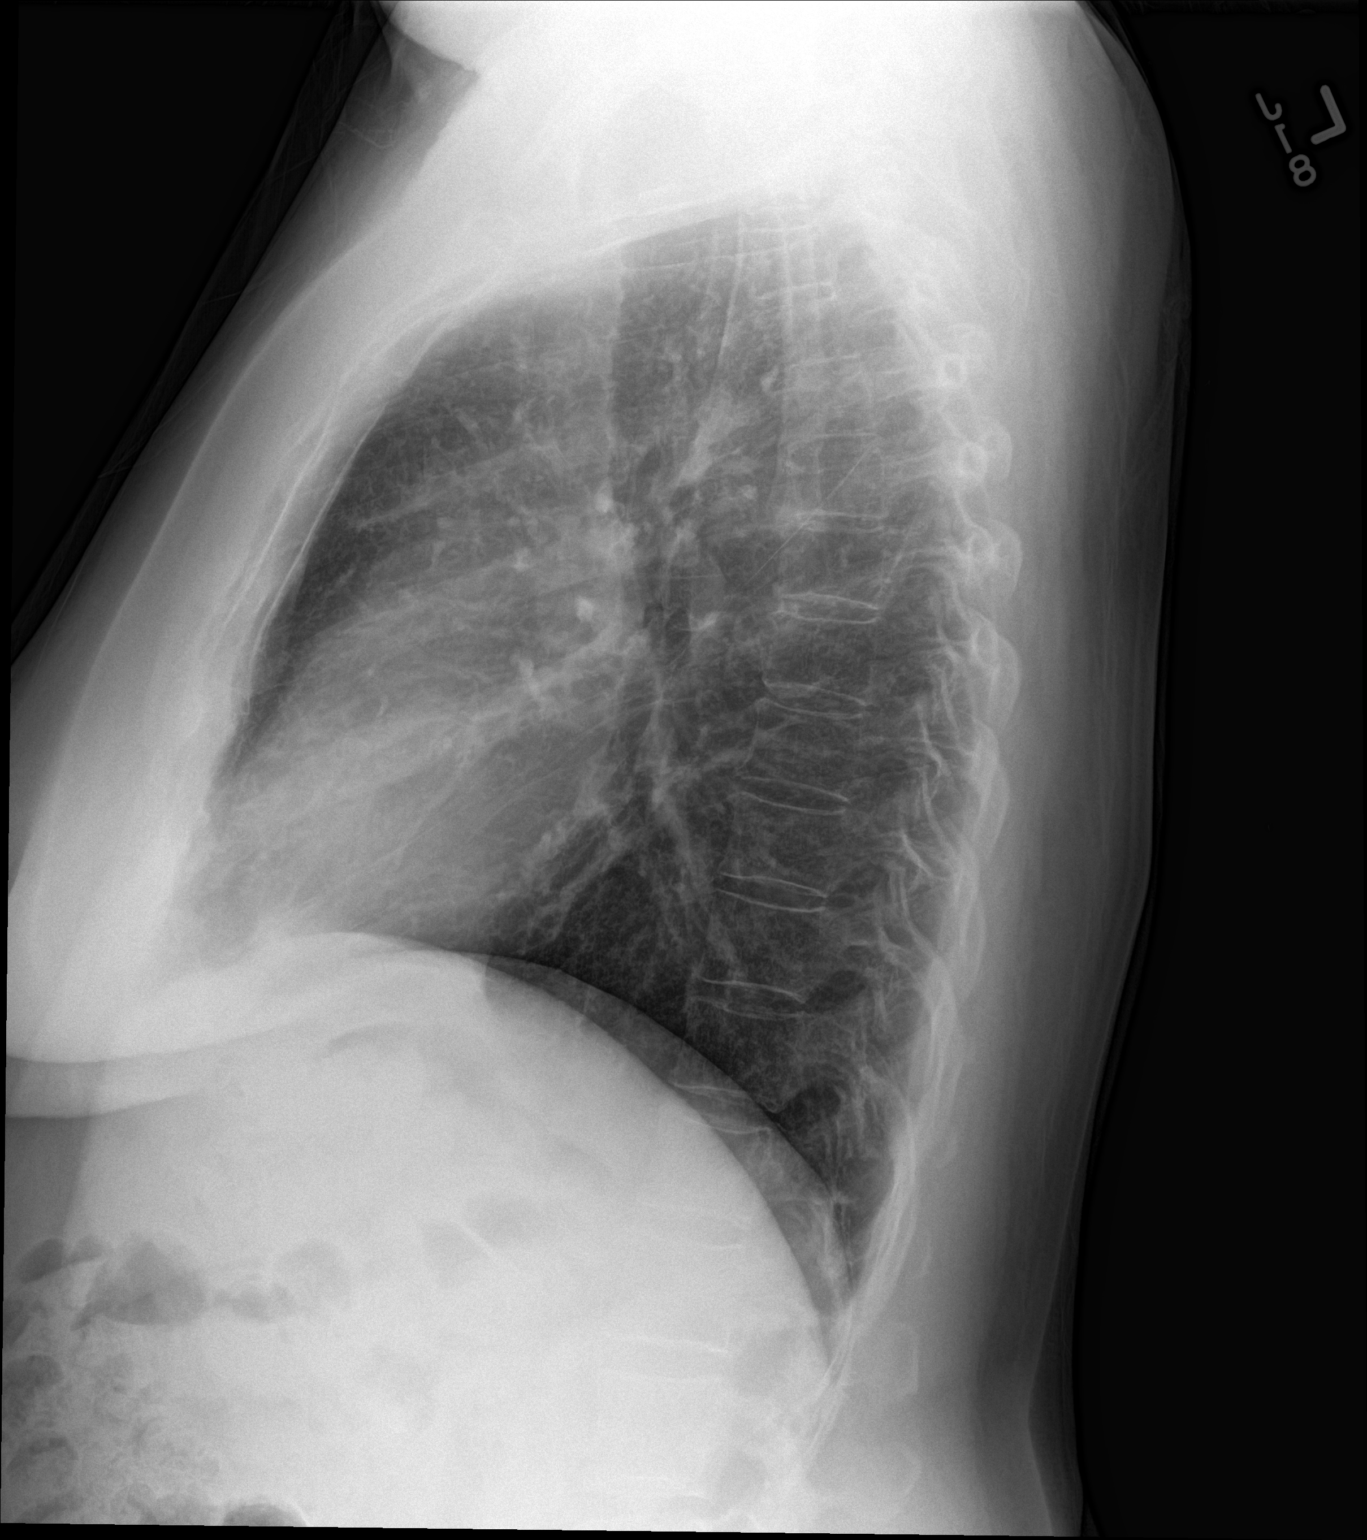

[2 of 2 positions shown; findings below may reference images not displayed]

FINDINGS: Lungs are somewhat hypoinflated without consolidation or effusion.
Cardiomediastinal silhouette is within normal. Subtle spondylosis of
the spine. Possible mild anterior wedging of mid to upper thoracic
vertebral body.
IMPRESSION: No active cardiopulmonary disease.

## 2016-06-04 ENCOUNTER — Ambulatory Visit (INDEPENDENT_AMBULATORY_CARE_PROVIDER_SITE_OTHER): Payer: Medicare Other | Admitting: Family Medicine

## 2016-06-04 ENCOUNTER — Encounter: Payer: Self-pay | Admitting: Family Medicine

## 2016-06-04 DIAGNOSIS — I10 Essential (primary) hypertension: Secondary | ICD-10-CM | POA: Diagnosis not present

## 2016-06-04 DIAGNOSIS — J209 Acute bronchitis, unspecified: Secondary | ICD-10-CM | POA: Diagnosis not present

## 2016-06-04 DIAGNOSIS — J302 Other seasonal allergic rhinitis: Secondary | ICD-10-CM

## 2016-06-04 MED ORDER — FLUTICASONE PROPIONATE 50 MCG/ACT NA SUSP: 2.0000 | Freq: Every day | NASAL | 6 refills | Status: DC

## 2016-06-04 NOTE — Assessment & Plan Note (Signed)
Suspect persistent congestion and sneezing and postnasal drip related allergies. We will add Flonase to her Claritin. She'll continue to monitor. If not improving she'll let us know.

## 2016-06-04 NOTE — Patient Instructions (Signed)
Nice to see you. I'm glad you're feeling better. We are going to add Flonase to help with your allergies. You should continue the Claritin. Please continue your blood pressure medication as well. I'll see you back in 3 months. If you develop chest pain, shortness of breath, fever, cough productive of blood or any new or change in symptoms please seek medical attention.

## 2016-06-04 NOTE — Progress Notes (Signed)
  Tommi Rumps, MD Phone: [phone]  Robin Becker is a 66 y.o. female who presents today for follow-up.  Patient was seen in the emergency room and diagnosed with bronchitis. Treated with azithromycin, prednisone, and DuoNeb's. She notes her symptoms are significantly improved. No cough. Minimal chest congestion. No wheezing. No fevers. No chills. Does note some sinus congestion with sneezing and postnasal drip consistent with her prior allergies. Notes her nose feels dry. No vision changes. No cough productive of blood. Overall feels improved.  Hypertension: Taking her Benicar. Blood pressure typically runs around 140/80 at home. Well control today. No chest pain or shortness of breath.  PMH: Smoker   ROS see history of present illness  Objective  Physical Exam Vitals:   06/04/16 0848  BP: 118/80  Pulse: 67  Temp: 98 F (36.7 C)    BP Readings from Last 3 Encounters:  06/04/16 118/80  05/29/16 (!) 147/70  12/20/15 (!) 150/71   Wt Readings from Last 3 Encounters:  06/04/16 162 lb (73.5 kg)  12/20/15 158 lb (71.7 kg)  12/12/15 157 lb (71.2 kg)    Physical Exam  Constitutional: She is well-developed, well-nourished, and in no distress.  HENT:  Head: Normocephalic and atraumatic.  Mouth/Throat: Oropharynx is clear and moist. No oropharyngeal exudate.  Normal TMs bilaterally  Eyes: Conjunctivae are normal. Pupils are equal, round, and reactive to light.  Cardiovascular: Normal rate, regular rhythm and normal heart sounds.   Pulmonary/Chest: Effort normal and breath sounds normal.  Neurological: She is alert. Gait normal.     Assessment/Plan: Please see individual problem list.  Hypertension At goal today. Continue current medications.  Seasonal allergies Suspect persistent congestion and sneezing and postnasal drip related allergies. We will add Flonase to her Claritin. She'll continue to monitor. If not improving she'll let us know.  Acute  bronchitis Recently seen and treated for bronchitis through the emergency room. She is significantly improved at this time. Benign exam today. Vital signs stable. She'll continue to monitor for recurrence. Given return precautions.   No orders of the defined types were placed in this encounter.   Meds ordered this encounter  Medications  . fluticasone (FLONASE) 50 MCG/ACT nasal spray    Sig: Place 2 sprays into both nostrils daily.    Dispense:  16 g    Refill:  [place], MD [place]

## 2016-06-04 NOTE — Progress Notes (Signed)
Pre visit review using our clinic review tool, if applicable. No additional management support is needed unless otherwise documented below in the visit note. 

## 2016-06-04 NOTE — Assessment & Plan Note (Signed)
Recently seen and treated for bronchitis through the emergency room. She is significantly improved at this time. Benign exam today. Vital signs stable. She'll continue to monitor for recurrence. Given return precautions.

## 2016-06-04 NOTE — Assessment & Plan Note (Signed)
At goal today. Continue current medications. 

## 2016-06-12 ENCOUNTER — Telehealth: Payer: Self-pay

## 2016-06-12 NOTE — Telephone Encounter (Signed)
Left message to return call 

## 2016-06-12 NOTE — Telephone Encounter (Signed)
Patient is currently taking Prilosec for reflux. Patient would like treatment. Please advise.

## 2016-06-12 NOTE — Telephone Encounter (Signed)
-----   Message from Leone Haven, MD sent at 06/12/2016 12:23 PM EST ----- Regarding: osteoporosis Please contact the patient and let her know that her last bone density test revealed osteoporosis. She should be on treatment for this with a medication like fosamax as long as she does not have reflux or esophageal issues. Please see if she has these issues and if she would like to start on treatment. Thanks. Eric.

## 2016-06-14 NOTE — Telephone Encounter (Signed)
Given history of reflux patient would likely benefit from Prolia or infusion of a bisphosphonate. The Prolia is every 6 months in the office. The infusion would likely require a trip to short stay at the hospital on a periodic basis likely every 6 months to a year. If she would prefer the infusion we will need to check and see how to arrange this. Thanks.

## 2016-06-15 NOTE — Telephone Encounter (Signed)
Please advise 

## 2016-06-23 ENCOUNTER — Other Ambulatory Visit: Payer: Self-pay | Admitting: Family Medicine

## 2016-06-23 ENCOUNTER — Ambulatory Visit (INDEPENDENT_AMBULATORY_CARE_PROVIDER_SITE_OTHER): Payer: Medicare Other | Admitting: Family Medicine

## 2016-06-23 ENCOUNTER — Encounter: Payer: Self-pay | Admitting: Family Medicine

## 2016-06-23 VITALS — BP 130/80 | HR 70 | Temp 98.5°F | Wt 161.0 lb

## 2016-06-23 DIAGNOSIS — I10 Essential (primary) hypertension: Secondary | ICD-10-CM | POA: Diagnosis not present

## 2016-06-23 DIAGNOSIS — R51 Headache: Principal | ICD-10-CM

## 2016-06-23 DIAGNOSIS — R519 Headache, unspecified: Secondary | ICD-10-CM

## 2016-06-23 LAB — COMPREHENSIVE METABOLIC PANEL
ALT: 13 U/L (ref 0–35)
AST: 17 U/L (ref 0–37)
Albumin: 4.2 g/dL (ref 3.5–5.2)
Alkaline Phosphatase: 89 U/L (ref 39–117)
BUN: 16 mg/dL (ref 6–23)
CALCIUM: 9.4 mg/dL (ref 8.4–10.5)
CHLORIDE: 107 meq/L (ref 96–112)
CO2: 29 meq/L (ref 19–32)
CREATININE: 0.67 mg/dL (ref 0.40–1.20)
GFR: 93.19 mL/min (ref >60.00)
GLUCOSE: 100 mg/dL — AB (ref 70–99)
Potassium: 4.3 mEq/L (ref 3.5–5.1)
SODIUM: 141 meq/L (ref 135–145)
Total Bilirubin: 0.4 mg/dL (ref 0.2–1.2)
Total Protein: 7.1 g/dL (ref 6.0–8.3)

## 2016-06-23 LAB — LIPID PANEL
CHOL/HDL RATIO: 4
Cholesterol: 246 mg/dL — ABNORMAL HIGH (ref 0–200)
HDL: 58.1 mg/dL (ref >39.00)
LDL Cholesterol: 160 mg/dL — ABNORMAL HIGH (ref 0–99)
NONHDL: 188.32
TRIGLYCERIDES: 143 mg/dL (ref 0.0–149.0)
VLDL: 28.6 mg/dL (ref 0.0–40.0)

## 2016-06-23 LAB — HEMOGLOBIN A1C: Hgb A1c MFr Bld: 6.6 % — ABNORMAL HIGH (ref 4.6–6.5)

## 2016-06-23 LAB — SEDIMENTATION RATE: Sed Rate: 34 mm/hr — ABNORMAL HIGH (ref 0–30)

## 2016-06-23 MED ORDER — PREDNISONE 50 MG PO TABS: 50.0000 mg | ORAL_TABLET | Freq: Every day | ORAL | 0 refills | Status: DC

## 2016-06-23 MED ORDER — ATORVASTATIN CALCIUM 40 MG PO TABS: 40.0000 mg | ORAL_TABLET | Freq: Every day | ORAL | 3 refills | Status: DC

## 2016-06-23 NOTE — Progress Notes (Signed)
Robin Rumps, MD Phone: [phone]  Robin Becker is a 66 y.o. female who presents today for same day visit.  Patient notes for the last week she's had a frontal and bitemporal and also left-sided posterior headache. Notes it is a dull headache in nature. Not a severe headache. Comes and goes. Gradual onset. Notes dizziness as well with this. Feels as though when she changes positions with her head she gets dizzy and feels as though she is on water. Does not describe room spinning. Notes both her hands in the fingers have felt numb and weak at times. No numbness or weakness elsewhere. Notes tinnitus bilaterally. Note ear fullness. Notes some mild blurry vision as well. She reports no history of headaches though there is a problem in her problem list that describes frequent headaches.  PMH: Smoker   ROS see history of present illness  Objective  Physical Exam Vitals:   06/23/16 0802  BP: 130/80  Pulse: 70  Temp: 98.5 F (36.9 C)    BP Readings from Last 3 Encounters:  06/23/16 130/80  06/04/16 118/80  05/29/16 (!) 147/70   Wt Readings from Last 3 Encounters:  06/23/16 161 lb (73 kg)  06/04/16 162 lb (73.5 kg)  12/20/15 158 lb (71.7 kg)    Physical Exam  Constitutional: No distress.  HENT:  Head: Normocephalic and atraumatic.  Mouth/Throat: Oropharynx is clear and moist. No oropharyngeal exudate.  Normal TMs bilaterally  Eyes: Conjunctivae are normal. Pupils are equal, round, and reactive to light.  Cardiovascular: Normal rate, regular rhythm and normal heart sounds.   Pulmonary/Chest: Effort normal and breath sounds normal.  Musculoskeletal: She exhibits no edema.  Neurological: She is alert.  CN 2-12 intact, 5/5 strength in bilateral biceps, triceps, grip, quads, hamstrings, plantar and dorsiflexion, sensation to light touch intact in bilateral UE and LE, normal gait, 2+ patellar reflexes, normal finger to nose, normal rapid alternating movements, negative  Romberg, no pronator drift  Skin: Skin is warm and dry. She is not diaphoretic.  Patient had vertiginous symptoms on right-sided Dix-Hallpike though is unable to tolerate keeping her eyes open to notice if she had nystagmus   Assessment/Plan: Please see individual problem list.  New onset of headaches after age 70 Patient with new onset type headache after age 55. Does report some vertigo. Some nonspecific numbness and weakness in her bilateral hands. No unilateral neurological symptoms. Suspect potential BPPV versus vestibular neuritis as cause of vertigo though this would not necessarily explain her headaches. Given new onset type headaches we will obtain an MRI later this week to evaluate for potential other causes. She is neurologically intact at this time. Given bilateral nature of headache and constellation of other symptoms doubt temporal arteritis though we'll obtain a ESR to evaluate this. She'll be given modified Epley maneuver to do for the right side given possibly positive Dix-Hallpike. I discussed return precautions and if she develops any persistent or new symptoms or change in her symptoms she'll seek medical attention immediately.   Orders Placed This Encounter  Procedures  . MR Brain Wo Contrast    Standing Status:   Future    Standing Expiration Date:   08/21/2017    Order Specific Question:   Reason for Exam (SYMPTOM  OR DIAGNOSIS REQUIRED)    Answer:   new onset headache >50 yo, vertigo, intermittent hand numbness and weakness bilaterally    Order Specific Question:   Preferred imaging location?    Answer:   [facility] (table limit-300lbs)  Order Specific Question:   What is the patient's sedation requirement?    Answer:   No Sedation    Order Specific Question:   Does the patient have a pacemaker or implanted devices?    Answer:   No  . Comp Met (CMET)  . Sed Rate (ESR)  . Lipid Profile  . HgB A1c   Robin Rumps, MD [place]

## 2016-06-23 NOTE — Assessment & Plan Note (Signed)
Patient with new onset type headache after age 66. Does report some vertigo. Some nonspecific numbness and weakness in her bilateral hands. No unilateral neurological symptoms. Suspect potential BPPV versus vestibular neuritis as cause of vertigo though this would not necessarily explain her headaches. Given new onset type headaches we will obtain an MRI later this week to evaluate for potential other causes. She is neurologically intact at this time. Given bilateral nature of headache and constellation of other symptoms doubt temporal arteritis though we'll obtain a ESR to evaluate this. She'll be given modified Epley maneuver to do for the right side given possibly positive Dix-Hallpike. I discussed return precautions and if she develops any persistent or new symptoms or change in her symptoms she'll seek medical attention immediately.

## 2016-06-23 NOTE — Progress Notes (Signed)
Pre visit review using our clinic review tool, if applicable. No additional management support is needed unless otherwise documented below in the visit note. 

## 2016-06-23 NOTE — Patient Instructions (Signed)
Nice to see you. Given your new onset headache we will obtain an MRI of your brain to evaluate for any specific cause. We will additionally obtain some lab work today and contact you with the results. We'll provide you with exercises to do at home to help with the vertigo. If you develop worsening headache, vision changes, numbness, weakness, persistent vertigo, or any new or changing symptoms please seek medical attention immediately.

## 2016-06-26 ENCOUNTER — Ambulatory Visit
Admission: RE | Admit: 2016-06-26 | Discharge: 2016-06-26 | Disposition: A | Discharge status: Home / Self Care | Payer: Medicare Other | Source: Ambulatory Visit | Attending: Family Medicine | Admitting: Family Medicine

## 2016-06-26 DIAGNOSIS — R269 Unspecified abnormalities of gait and mobility: Secondary | ICD-10-CM | POA: Diagnosis not present

## 2016-06-26 DIAGNOSIS — R51 Headache: Secondary | ICD-10-CM | POA: Insufficient documentation

## 2016-06-26 DIAGNOSIS — R42 Dizziness and giddiness: Secondary | ICD-10-CM | POA: Diagnosis not present

## 2016-06-26 DIAGNOSIS — R519 Headache, unspecified: Secondary | ICD-10-CM

## 2016-06-26 DIAGNOSIS — H8111 Benign paroxysmal vertigo, right ear: Secondary | ICD-10-CM | POA: Diagnosis not present

## 2016-07-03 NOTE — Telephone Encounter (Signed)
Patient has been out of town for several weeks and will return call when she comes home.

## 2016-07-06 ENCOUNTER — Telehealth: Payer: Self-pay | Admitting: Family Medicine

## 2016-07-06 NOTE — Telephone Encounter (Signed)
Pt is requesting a copy of her last lab results be sent to her home.  Thanks

## 2016-07-06 NOTE — Telephone Encounter (Signed)
Labs mailed

## 2016-07-16 DIAGNOSIS — H2512 Age-related nuclear cataract, left eye: Secondary | ICD-10-CM | POA: Diagnosis not present

## 2016-07-16 DIAGNOSIS — H538 Other visual disturbances: Secondary | ICD-10-CM | POA: Diagnosis not present

## 2016-09-29 ENCOUNTER — Encounter: Payer: Self-pay | Admitting: Family Medicine

## 2016-09-29 ENCOUNTER — Ambulatory Visit (INDEPENDENT_AMBULATORY_CARE_PROVIDER_SITE_OTHER): Payer: Medicare Other | Admitting: Family Medicine

## 2016-09-29 VITALS — BP 138/84 | HR 80 | Temp 98.9°F | Wt 159.8 lb

## 2016-09-29 DIAGNOSIS — I10 Essential (primary) hypertension: Secondary | ICD-10-CM | POA: Diagnosis not present

## 2016-09-29 DIAGNOSIS — E785 Hyperlipidemia, unspecified: Secondary | ICD-10-CM

## 2016-09-29 DIAGNOSIS — E119 Type 2 diabetes mellitus without complications: Secondary | ICD-10-CM

## 2016-09-29 DIAGNOSIS — L239 Allergic contact dermatitis, unspecified cause: Secondary | ICD-10-CM

## 2016-09-29 DIAGNOSIS — R51 Headache: Secondary | ICD-10-CM | POA: Diagnosis not present

## 2016-09-29 DIAGNOSIS — H539 Unspecified visual disturbance: Secondary | ICD-10-CM | POA: Insufficient documentation

## 2016-09-29 DIAGNOSIS — R519 Headache, unspecified: Secondary | ICD-10-CM

## 2016-09-29 LAB — LDL CHOLESTEROL, DIRECT: Direct LDL: 162 mg/dL

## 2016-09-29 LAB — COMPREHENSIVE METABOLIC PANEL
ALBUMIN: 4.3 g/dL (ref 3.5–5.2)
ALK PHOS: 91 U/L (ref 39–117)
ALT: 15 U/L (ref 0–35)
AST: 19 U/L (ref 0–37)
BILIRUBIN TOTAL: 0.4 mg/dL (ref 0.2–1.2)
BUN: 15 mg/dL (ref 6–23)
CALCIUM: 9.7 mg/dL (ref 8.4–10.5)
CO2: 29 meq/L (ref 19–32)
CREATININE: 0.69 mg/dL (ref 0.40–1.20)
Chloride: 106 mEq/L (ref 96–112)
GFR: 90.01 mL/min (ref >60.00)
Glucose, Bld: 101 mg/dL — ABNORMAL HIGH (ref 70–99)
Potassium: 4.4 mEq/L (ref 3.5–5.1)
Sodium: 141 mEq/L (ref 135–145)
Total Protein: 7.3 g/dL (ref 6.0–8.3)

## 2016-09-29 LAB — HEMOGLOBIN A1C: HEMOGLOBIN A1C: 6.4 % (ref 4.6–6.5)

## 2016-09-29 MED ORDER — OLMESARTAN MEDOXOMIL 20 MG PO TABS: 10.0000 mg | ORAL_TABLET | Freq: Every day | ORAL | 3 refills | Status: DC

## 2016-09-29 MED ORDER — TRIAMCINOLONE ACETONIDE 0.1 % EX CREA: 1.0000 "application " | TOPICAL_CREAM | Freq: Two times a day (BID) | CUTANEOUS | 0 refills | Status: DC

## 2016-09-29 NOTE — Assessment & Plan Note (Signed)
Symptoms resolved. Monitor for recurrence. 

## 2016-09-29 NOTE — Assessment & Plan Note (Signed)
Patient followed by ophthalmology following cataract surgery. Notes chronic stable vision changes in her right eye that resolve with wearing glasses. No recent or acute changes. She'll continue to follow with ophthalmology.

## 2016-09-29 NOTE — Progress Notes (Signed)
Tommi Rumps, MD Phone: [phone]  Robin Becker is a 66 y.o. female who presents today for follow-up.  Hypertension: Taking olmesartan though not every day as she notes her blood pressure was getting down into the 100/60 range and she would feel poorly with this. Not checking her blood pressures at home. No chest pain, short of breath, or edema.  Diabetes: Most recent A1c 6.6. No polyuria or polydipsia. She has changed her diet significantly and is eating much healthier. Lots of vegetables. Oatmeal as well. She exercises by walking daily. She feels significantly better with this.  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 Gets rash or dry skin. Itches. Claritin does help some. Has been on topical steroids previously.  She notes her headaches resolved. She had an MRI that was reassuring.  She reports she had cataract surgery last year. She has had slight vision issue in her right eye since the cataract surgery and is now wearing glasses. Notes she has no vision issues with glasses on. She saw her eye doctor earlier this year and states everything was okay. She notes no vision changes over the last year.  PMH: smoker   ROS see history of present illness  Objective  Physical Exam Vitals:   09/29/16 0953  BP: 138/84  Pulse: 80  Temp: 98.9 F (37.2 C)    BP Readings from Last 3 Encounters:  09/29/16 138/84  06/23/16 130/80  06/04/16 118/80   Wt Readings from Last 3 Encounters:  09/29/16 159 lb 12.8 oz (72.5 kg)  06/23/16 161 lb (73 kg)  06/04/16 162 lb (73.5 kg)    Physical Exam  Constitutional: No distress.  HENT:  Head: Normocephalic and atraumatic.  Eyes: Conjunctivae are normal. Pupils are equal, round, and reactive to light.  Cardiovascular: Normal rate, regular rhythm and normal heart sounds.   Pulmonary/Chest: Effort normal and breath sounds normal.  Neurological: She is alert. Gait normal.  Skin: Skin is warm and dry. She is not diaphoretic.   Few scattered erythematous papules on her right dorsal forearm, dry skin over the dorsal aspect of her index finger proximally, excoriations on her bilateral legs     Assessment/Plan: Please see individual problem list.  Allergic dermatitis Patient with likely atopic dermatitis. We'll try triamcinolone for this.  New onset of headaches after age 71 Symptoms resolved. Monitor for recurrence.  HLD (hyperlipidemia) Check LDL cholesterol. Continue diet and exercise.  Diabetes (HCC) Check A1c. She's been diligently working on diet and exercise. Encouraged continued diet and exercise.  Hypertension Not at goal. She reports she's not taking the medication every day. Has not taken it today. She was feeling poorly when taking the full dose every day. We'll decrease the olmesartan to 10 mg daily. She'll monitor her blood pressure and return in 2 weeks for a blood pressure check.  Vision disorder Patient followed by ophthalmology following cataract surgery. Notes chronic stable vision changes in her right eye that resolve with wearing glasses. No recent or acute changes. She'll continue to follow with ophthalmology.   Orders Placed This Encounter  Procedures  . Direct LDL  . Comp Met (CMET)  . HgB A1c    Meds ordered this encounter  Medications  . triamcinolone cream (KENALOG) 0.1 %    Sig: Apply 1 application topically 2 (two) times daily.    Dispense:  30 g    Refill:  0  . olmesartan (BENICAR) 20 MG tablet    Sig: Take 0.5 tablets (10 mg total)  by mouth daily.    Dispense:  45 tablet    Refill:  [place], MD [place]

## 2016-09-29 NOTE — Assessment & Plan Note (Signed)
Not at goal. She reports she's not taking the medication every day. Has not taken it today. She was feeling poorly when taking the full dose every day. We'll decrease the olmesartan to 10 mg daily. She'll monitor her blood pressure and return in 2 weeks for a blood pressure check.

## 2016-09-29 NOTE — Patient Instructions (Signed)
Nice to see you. We are going to decrease your blood pressure medicine dose. You will take olmesartan 10 mg daily. Please monitor your blood pressure and if you start to feel poorly or your blood pressures run low please let us know. We will check lab work and contact you with the results. Please try the topical cream for your skin. Please keep follow-up with your eye doctor.

## 2016-09-29 NOTE — Assessment & Plan Note (Signed)
Check LDL cholesterol. Continue diet and exercise.

## 2016-09-29 NOTE — Assessment & Plan Note (Signed)
Patient with likely atopic dermatitis. We'll try triamcinolone for this.

## 2016-09-29 NOTE — Assessment & Plan Note (Signed)
Check A1c. She's been diligently working on diet and exercise. Encouraged continued diet and exercise.

## 2016-09-30 ENCOUNTER — Other Ambulatory Visit: Payer: Self-pay | Admitting: Family Medicine

## 2016-09-30 DIAGNOSIS — E785 Hyperlipidemia, unspecified: Secondary | ICD-10-CM

## 2016-11-03 ENCOUNTER — Other Ambulatory Visit (INDEPENDENT_AMBULATORY_CARE_PROVIDER_SITE_OTHER): Payer: Medicare Other

## 2016-11-03 DIAGNOSIS — E785 Hyperlipidemia, unspecified: Secondary | ICD-10-CM | POA: Diagnosis not present

## 2016-11-03 LAB — HEPATIC FUNCTION PANEL
ALBUMIN: 4.1 g/dL (ref 3.5–5.2)
ALK PHOS: 99 U/L (ref 39–117)
ALT: 14 U/L (ref 0–35)
AST: 18 U/L (ref 0–37)
BILIRUBIN DIRECT: 0.1 mg/dL (ref 0.0–0.3)
Total Bilirubin: 0.4 mg/dL (ref 0.2–1.2)
Total Protein: 6.7 g/dL (ref 6.0–8.3)

## 2016-11-03 LAB — LDL CHOLESTEROL, DIRECT: LDL DIRECT: 75 mg/dL

## 2017-01-20 DIAGNOSIS — Z9841 Cataract extraction status, right eye: Secondary | ICD-10-CM | POA: Diagnosis not present

## 2017-01-20 DIAGNOSIS — H40033 Anatomical narrow angle, bilateral: Secondary | ICD-10-CM | POA: Diagnosis not present

## 2017-01-20 DIAGNOSIS — H2512 Age-related nuclear cataract, left eye: Secondary | ICD-10-CM | POA: Diagnosis not present

## 2017-01-29 ENCOUNTER — Other Ambulatory Visit: Payer: Self-pay

## 2017-01-29 MED ORDER — OLMESARTAN MEDOXOMIL 20 MG PO TABS: 10.0000 mg | ORAL_TABLET | Freq: Every day | ORAL | 3 refills | Status: DC

## 2017-02-17 DIAGNOSIS — H538 Other visual disturbances: Secondary | ICD-10-CM | POA: Diagnosis not present

## 2017-02-17 DIAGNOSIS — H2513 Age-related nuclear cataract, bilateral: Secondary | ICD-10-CM | POA: Diagnosis not present

## 2017-02-17 DIAGNOSIS — H25013 Cortical age-related cataract, bilateral: Secondary | ICD-10-CM | POA: Diagnosis not present

## 2017-02-17 DIAGNOSIS — H16223 Keratoconjunctivitis sicca, not specified as Sjogren's, bilateral: Secondary | ICD-10-CM | POA: Diagnosis not present

## 2017-08-18 ENCOUNTER — Telehealth: Payer: Self-pay | Admitting: Family Medicine

## 2017-08-18 ENCOUNTER — Other Ambulatory Visit: Payer: Self-pay | Admitting: Internal Medicine

## 2017-08-18 ENCOUNTER — Ambulatory Visit (INDEPENDENT_AMBULATORY_CARE_PROVIDER_SITE_OTHER): Payer: Medicare Other | Admitting: Internal Medicine

## 2017-08-18 ENCOUNTER — Encounter: Payer: Self-pay | Admitting: Internal Medicine

## 2017-08-18 VITALS — BP 118/68 | HR 75 | Temp 98.5°F | Ht 64.0 in | Wt 157.8 lb

## 2017-08-18 DIAGNOSIS — K219 Gastro-esophageal reflux disease without esophagitis: Secondary | ICD-10-CM

## 2017-08-18 DIAGNOSIS — L309 Dermatitis, unspecified: Secondary | ICD-10-CM | POA: Diagnosis not present

## 2017-08-18 DIAGNOSIS — E785 Hyperlipidemia, unspecified: Secondary | ICD-10-CM | POA: Diagnosis not present

## 2017-08-18 DIAGNOSIS — J302 Other seasonal allergic rhinitis: Secondary | ICD-10-CM

## 2017-08-18 DIAGNOSIS — Z13818 Encounter for screening for other digestive system disorders: Secondary | ICD-10-CM | POA: Diagnosis not present

## 2017-08-18 DIAGNOSIS — I10 Essential (primary) hypertension: Secondary | ICD-10-CM

## 2017-08-18 DIAGNOSIS — L819 Disorder of pigmentation, unspecified: Secondary | ICD-10-CM

## 2017-08-18 DIAGNOSIS — Z1231 Encounter for screening mammogram for malignant neoplasm of breast: Secondary | ICD-10-CM | POA: Diagnosis not present

## 2017-08-18 DIAGNOSIS — Z1159 Encounter for screening for other viral diseases: Secondary | ICD-10-CM | POA: Diagnosis not present

## 2017-08-18 DIAGNOSIS — R7303 Prediabetes: Secondary | ICD-10-CM

## 2017-08-18 DIAGNOSIS — E559 Vitamin D deficiency, unspecified: Secondary | ICD-10-CM

## 2017-08-18 DIAGNOSIS — Z1329 Encounter for screening for other suspected endocrine disorder: Secondary | ICD-10-CM

## 2017-08-18 MED ORDER — ESOMEPRAZOLE MAGNESIUM 20 MG PO PACK: 20.0000 mg | PACK | ORAL | 0 refills | Status: DC | PRN

## 2017-08-18 MED ORDER — CLOBETASOL PROPIONATE 0.05 % EX CREA: 1.0000 "application " | TOPICAL_CREAM | Freq: Two times a day (BID) | CUTANEOUS | 2 refills | Status: DC

## 2017-08-18 MED ORDER — FEXOFENADINE HCL 180 MG PO TABS: 180.0000 mg | ORAL_TABLET | Freq: Every day | ORAL | 3 refills | Status: DC

## 2017-08-18 MED ORDER — FLUTICASONE PROPIONATE 50 MCG/ACT NA SUSP: 2.0000 | Freq: Every day | NASAL | 11 refills | Status: DC

## 2017-08-18 MED ORDER — OLMESARTAN MEDOXOMIL 20 MG PO TABS: 20.0000 mg | ORAL_TABLET | Freq: Every day | ORAL | 0 refills | Status: DC

## 2017-08-18 NOTE — Telephone Encounter (Signed)
Please advise about medication change.  

## 2017-08-18 NOTE — Patient Instructions (Addendum)
Schedule labs no food only water and pills for 12 hours  Follow up in the next month with your regular doctor  Schedule mammogram please  Try Cetaphil or Cerave cream for skin to moisturize    Colesterol Cholesterol El colesterol es una sustancia [place], cerosa, similar a la grasa, que el cuerpo necesita en pequeas cantidades. El hgado fabrica todo el colesterol que el cuerpo necesita. La sangre transporta el colesterol desde el hgado a travs de los vasos sanguneos. Los depsitos de colesterol (placas) podran acumularse en las paredes de los vasos sanguneos (arterias). Las Occupational hygienist y la rigidez [place] arterias. Las placas de colesterol aumentan el riesgo de infarto de miocardio y accidente cerebrovascular. Aunque sea muy elevado, la concentracin de colesterol no puede percibirse. La nica forma de saber que tiene colesterol alto es mediante un anlisis de [place]. Una vez que se conocen las concentraciones de Research officer, trade union, se Mining engineer un registro de los [place] de [place]. Trabaje con el mdico para [employer] en el rango deseado. [place] significan los resultados?  El colesterol total es una medida general de todo el colesterol en [place].  El colesterol LDL (lipoprotenas de baja densidad) es el colesterol "malo". Este tipo es el que hace que se acumulen placas en las paredes de las arterias. Su concentracin debe ser baja.  El colesterol HDL (lipoprotenas de alta densidad) es el colesterol "bueno", ya que limpia las arterias y Geneticist, molecular el LDL. Su concentracin debe ser alta.  Los triglicridos son grasas que el cuerpo puede quemar ya sea como fuente de energa o para Financial controller. Las concentraciones altas estn estrechamente vinculadas con las enfermedades cardacas. Cules son las concentraciones de colesterol deseadas?  El colesterol total debe estar por debajo de 200.  Para las personas con riesgo, lo aconsejable es mantener el nivel  de LDL por debajo de 100; y, para las personas con alto riesgo, es por debajo de 44.  Se aconseja mantener un nivel de HDL es por encima de 40. Se considera que un nivel de 60 o superior protege contra las enfermedades cardacas.  Los triglicridos por debajo de 150. Cmo puedo bajar el colesterol? Dieta Siga el programa de alimentacin que el mdico le indique.  Elija el pescado o la carne blanca de pollo y [place], asados u horneados. Limite los cortes grasos de carne roja, los alimentos fritos y las carnes procesadas, como las salchichas y los embutidos.  Coma gran cantidad de frutas y verduras frescas.  Elija los cereales integrales, los frijoles, las pastas, las papas y los cereales.  Elija aceite de oliva, aceite de maz o aceite de canola, y solo use poca cantidad.  No coma mantequilla, [place], margarina o aceites de [place].  Evite los alimentos que contengan grasas trans.  Tome [employer] o sin grasa y coma yogur y quesos descremados o sin grasa. Evite la Mattel, la crema, los [place], las yemas de [place] y los quesos enteros.  Los postres sanos incluyen la torta ngel, los bocadillos de jengibre, las Administrator con forma de [place], los caramelos duros, los helados de agua y el yogur helado descremado o semidescremado. Evite las [place], tortas, pasteles y [place].  La prctica de actividad fsica.  Siga el programa de ejercicios que le haya indicado el mdico. Programa regular: ? Ayuda a bajar el colesterol LDL y a aumentar el HDL. ? Ayuda a Technical sales engineer.  Haga cosas que aumenten el nivel de [place]; por [place], [employer]  trabajos de [place], salga a caminar o use las escaleras.  Pregunte al [employer] formas de aumentar la actividad en la vida diaria.  Medicamentos  Delphi de venta libre y los recetados solamente como se lo haya indicado el mdico. ? El mdico puede recetarle medicamentos para ayudar a Advertising copywriter y  reducir el riesgo de enfermedades cardacas. Por lo general, esto se hace si con la dieta y la actividad fsica no se logra disminuir el nivel de colesterol. ? Si tiene varios factores de riesgo, tal vez tenga que tomar medicamentos, incluso si las concentraciones son normales.  Esta informacin no tiene Marine scientist el consejo del mdico. Asegrese de hacerle al mdico cualquier pregunta que tenga. Document Released: 02/04/2005 Document Revised: 07/27/2016 Document Reviewed: 10/26/2015 Elsevier Interactive Patient Education  [employer].

## 2017-08-18 NOTE — Telephone Encounter (Signed)
Copied from [place]. Topic: Quick Communication - See Telephone Encounter >> Aug 18, 2017 11:47 AM Synthia Innocent wrote: CRM for notification. See Telephone encounter for: 08/18/17. Pharmacy calling, requesting RX of esomeprazole (NEXIUM) 20 MG packet be changed to capsule

## 2017-08-18 NOTE — Progress Notes (Signed)
Pre visit review using our clinic review tool, if applicable. No additional management support is needed unless otherwise documented below in the visit note. 

## 2017-08-19 ENCOUNTER — Other Ambulatory Visit: Payer: Self-pay | Admitting: Internal Medicine

## 2017-08-19 DIAGNOSIS — K219 Gastro-esophageal reflux disease without esophagitis: Secondary | ICD-10-CM

## 2017-08-19 MED ORDER — ESOMEPRAZOLE MAGNESIUM 20 MG PO CPDR: 20.0000 mg | DELAYED_RELEASE_CAPSULE | Freq: Every day | ORAL | 1 refills | Status: DC

## 2017-08-19 NOTE — Telephone Encounter (Signed)
Sent as capsule  Robin Becker

## 2017-08-19 NOTE — Telephone Encounter (Signed)
Patient saw Dr McLean-Scocuzza for this prescription. I will forward to her to see if there a reason for the nexium packets as opposed to nexium capsules.

## 2017-08-21 ENCOUNTER — Encounter: Payer: Self-pay | Admitting: Internal Medicine

## 2017-08-21 NOTE — Progress Notes (Signed)
Chief Complaint  Patient presents with  . Follow-up   Follow up  1. C/o itching to b/l hands she is using gloves though unsure if latex to change her husbands osteomy bag. TMC 0.1 % is not helping and she wants to try something different 2. H/o HLD stopped lipitor 40 mg due to nausea  3. C/o discoloration to legs b/l  4. She wants refills on Nexium, flonase, benicar, allegra  5. HTN controlled on benicar   Review of Systems  Skin:       +skin discoloration    Past Medical History:  Diagnosis Date  . Allergy    Seasonal  . Chicken pox   . Depression   . GERD (gastroesophageal reflux disease)   . Glaucoma    s/p surgery  . History of chicken pox   . History of pneumonia 2013   [facility]  . Hypertension   . Prediabetes 2011   h/o A1c 6.5%  . Seasonal allergies   . Smoking    Past Surgical History:  Procedure Laterality Date  . CATARACT EXTRACTION W/PHACO Right 08/27/2015   Procedure: CATARACT EXTRACTION PHACO AND INTRAOCULAR LENS PLACEMENT (IOC);  Surgeon: Birder Robson, MD;  Location: [facility] ORS;  Service: Ophthalmology;  Laterality: Right;  [place] 00:53 AP% 20.1 CDE 10.80 fluid pack lot # [id number] [place]  . COLONOSCOPY     Family History  Problem Relation Age of Onset  . Heart disease Mother   . Stroke Mother   . Cancer Sister 47       colon and otherwise  . Stroke Maternal Aunt   . Coronary artery disease Cousin   . Diabetes Neg Hx   . Breast cancer Neg Hx    Social History   Socioeconomic History  . Marital status: Married    Spouse name: Not on file  . Number of children: Not on file  . Years of education: Not on file  . Highest education level: Not on file  Occupational History  . Not on file  Social Needs  . Financial resource strain: Not on file  . Food insecurity:    Worry: Not on file    Inability: Not on file  . Transportation needs:    Medical: Not on file    Non-medical: Not on file  Tobacco Use  . Smoking status: Current  Every Day Smoker    Packs/day: 0.25    Types: Cigarettes  . Smokeless tobacco: Never Used  Substance and Sexual Activity  . Alcohol use: Yes    Alcohol/week: 0.0 oz    Comment: Regular on weekends  . Drug use: No  . Sexual activity: Not on file  Lifestyle  . Physical activity:    Days per week: Not on file    Minutes per session: Not on file  . Stress: Not on file  Relationships  . Social connections:    Talks on phone: Not on file    Gets together: Not on file    Attends religious service: Not on file    Active member of club or organization: Not on file    Attends meetings of clubs or organizations: Not on file    Relationship status: Not on file  . Intimate partner violence:    Fear of current or ex partner: Not on file    Emotionally abused: Not on file    Physically abused: Not on file    Forced sexual activity: Not on file  Other  Topics Concern  . Not on file  Social History Narrative   Caffeine: 2 cups coffee/day   Lives with husband and son and step son   Occupation: homemaker   Edu:   Activity: wants to restart gym.  No regular activity   Diet: good water, vegetables daily   Current Meds  Medication Sig  . fluticasone (FLONASE) 50 MCG/ACT nasal spray Place 2 sprays into both nostrils daily.  Marland Kitchen olmesartan (BENICAR) 20 MG tablet Take 1 tablet (20 mg total) by mouth daily.  . [DISCONTINUED] esomeprazole (NEXIUM) 20 MG packet Take 20 mg by mouth as needed.  . [DISCONTINUED] esomeprazole (NEXIUM) 20 MG packet Take 20 mg by mouth as needed.  . [DISCONTINUED] fexofenadine (ALLEGRA) 30 MG/5ML suspension Take 30 mg by mouth daily.  . [DISCONTINUED] fluticasone (FLONASE) 50 MCG/ACT nasal spray Place 2 sprays into both nostrils daily.  . [DISCONTINUED] olmesartan (BENICAR) 20 MG tablet Take 0.5 tablets (10 mg total) by mouth daily.   No Known Allergies No results found for this or any previous visit (from the past 2160 hour(s)). Objective  Body mass index is 27.09  kg/m. Wt Readings from Last 3 Encounters:  08/18/17 157 lb 12.8 oz (71.6 kg)  09/29/16 159 lb 12.8 oz (72.5 kg)  06/23/16 161 lb (73 kg)   Temp Readings from Last 3 Encounters:  08/18/17 98.5 F (36.9 C) (Oral)  09/29/16 98.9 F (37.2 C) (Oral)  06/23/16 98.5 F (36.9 C) (Oral)   BP Readings from Last 3 Encounters:  08/18/17 118/68  09/29/16 138/84  06/23/16 130/80   Pulse Readings from Last 3 Encounters:  08/18/17 75  09/29/16 80  06/23/16 70    Physical Exam  Constitutional: She is oriented to person, place, and time. Vital signs are normal. She appears well-developed and well-nourished.  HENT:  Head: Normocephalic and atraumatic.  Mouth/Throat: Oropharynx is clear and moist and mucous membranes are normal.  Eyes: Pupils are equal, round, and reactive to light. Conjunctivae are normal.  Cardiovascular: Normal rate, regular rhythm and normal heart sounds.  Pulmonary/Chest: Effort normal and breath sounds normal.  Neurological: She is alert and oriented to person, place, and time. Gait normal.  Skin: Skin is warm and dry.  Eczematous changes to b/l hands likely contact dermatitis   Hypopigmentation b/l legs ? Post inflammatory hypopigmentation from pt scratching vs idopathic guttate hypomelanosis vs other   Psychiatric: She has a normal mood and affect. Her speech is normal and behavior is normal. Judgment and thought content normal. Cognition and memory are normal.  Nursing note and vitals reviewed.   Assessment   1. HTN/HLD 2. 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 Allergic rhintis 4. GERD  5. HM Plan  1. Refilled benicar 20 mg qd, pt stopped lipitor 40 mg 2/2 nausea  2. rec try nitrile gloves, moisturize hands and use clobetasol to hands/legs prn TMC 0.1% did not help  3. Refilled allegra/flonase  4. Refilled nexium  5.  Flu unknown Tdap needs to be updated  pna 23 had  09/16/11   Out of age window pap  Colonoscopy had 07/25/15  DEXA had 06/19/15  Referred mammogram   Provider: Dr. Olivia Mackie McLean-Scocuzza-Internal Medicine

## 2017-08-25 ENCOUNTER — Other Ambulatory Visit (INDEPENDENT_AMBULATORY_CARE_PROVIDER_SITE_OTHER): Payer: Medicare Other

## 2017-08-25 DIAGNOSIS — Z13818 Encounter for screening for other digestive system disorders: Secondary | ICD-10-CM

## 2017-08-25 DIAGNOSIS — R7303 Prediabetes: Secondary | ICD-10-CM

## 2017-08-25 DIAGNOSIS — E559 Vitamin D deficiency, unspecified: Secondary | ICD-10-CM

## 2017-08-25 DIAGNOSIS — Z1329 Encounter for screening for other suspected endocrine disorder: Secondary | ICD-10-CM

## 2017-08-25 DIAGNOSIS — I10 Essential (primary) hypertension: Secondary | ICD-10-CM

## 2017-08-25 DIAGNOSIS — Z1159 Encounter for screening for other viral diseases: Secondary | ICD-10-CM | POA: Diagnosis not present

## 2017-08-25 DIAGNOSIS — E785 Hyperlipidemia, unspecified: Secondary | ICD-10-CM | POA: Diagnosis not present

## 2017-08-25 LAB — URINALYSIS, ROUTINE W REFLEX MICROSCOPIC
Bilirubin Urine: NEGATIVE
Hgb urine dipstick: NEGATIVE
Ketones, ur: NEGATIVE
Nitrite: NEGATIVE
PH: 7.5 (ref 5.0–8.0)
RBC / HPF: NONE SEEN (ref 0–?)
SPECIFIC GRAVITY, URINE: 1.01 (ref 1.000–1.030)
TOTAL PROTEIN, URINE-UPE24: NEGATIVE
Urine Glucose: NEGATIVE
Urobilinogen, UA: 0.2 (ref 0.0–1.0)

## 2017-08-25 LAB — CBC WITH DIFFERENTIAL/PLATELET
Basophils Absolute: 0.1 10*3/uL (ref 0.0–0.1)
Basophils Relative: 0.9 % (ref 0.0–3.0)
EOS PCT: 2.6 % (ref 0.0–5.0)
Eosinophils Absolute: 0.2 10*3/uL (ref 0.0–0.7)
HCT: 39.8 % (ref 36.0–46.0)
HEMOGLOBIN: 13.3 g/dL (ref 12.0–15.0)
Lymphocytes Relative: 34 % (ref 12.0–46.0)
Lymphs Abs: 2.7 10*3/uL (ref 0.7–4.0)
MCHC: 33.4 g/dL (ref 30.0–36.0)
MCV: 83.3 fl (ref 78.0–100.0)
MONOS PCT: 6.9 % (ref 3.0–12.0)
Monocytes Absolute: 0.6 10*3/uL (ref 0.1–1.0)
Neutro Abs: 4.4 10*3/uL (ref 1.4–7.7)
Neutrophils Relative %: 55.6 % (ref 43.0–77.0)
Platelets: 268 10*3/uL (ref 150.0–400.0)
RBC: 4.78 Mil/uL (ref 3.87–5.11)
RDW: 14 % (ref 11.5–15.5)
WBC: 8 10*3/uL (ref 4.0–10.5)

## 2017-08-25 LAB — VITAMIN D 25 HYDROXY (VIT D DEFICIENCY, FRACTURES): VITD: 36.19 ng/mL (ref 30.00–100.00)

## 2017-08-25 LAB — LIPID PANEL
Cholesterol: 204 mg/dL — ABNORMAL HIGH (ref 0–200)
HDL: 50.6 mg/dL (ref >39.00)
LDL Cholesterol: 129 mg/dL — ABNORMAL HIGH (ref 0–99)
NONHDL: 153.46
Total CHOL/HDL Ratio: 4
Triglycerides: 123 mg/dL (ref 0.0–149.0)
VLDL: 24.6 mg/dL (ref 0.0–40.0)

## 2017-08-25 LAB — T4, FREE: FREE T4: 0.89 ng/dL (ref 0.60–1.60)

## 2017-08-25 LAB — COMPREHENSIVE METABOLIC PANEL
ALBUMIN: 3.9 g/dL (ref 3.5–5.2)
ALK PHOS: 83 U/L (ref 39–117)
ALT: 22 U/L (ref 0–35)
AST: 20 U/L (ref 0–37)
BUN: 16 mg/dL (ref 6–23)
CO2: 27 mEq/L (ref 19–32)
Calcium: 9.1 mg/dL (ref 8.4–10.5)
Chloride: 106 mEq/L (ref 96–112)
Creatinine, Ser: 0.62 mg/dL (ref 0.40–1.20)
GFR: 101.56 mL/min (ref >60.00)
Glucose, Bld: 98 mg/dL (ref 70–99)
POTASSIUM: 4 meq/L (ref 3.5–5.1)
Sodium: 140 mEq/L (ref 135–145)
TOTAL PROTEIN: 6.6 g/dL (ref 6.0–8.3)
Total Bilirubin: 0.4 mg/dL (ref 0.2–1.2)

## 2017-08-25 LAB — TSH: TSH: 1.25 u[IU]/mL (ref 0.35–4.50)

## 2017-08-25 LAB — HEMOGLOBIN A1C: Hgb A1c MFr Bld: 6.4 % (ref 4.6–6.5)

## 2017-08-25 NOTE — Addendum Note (Signed)
Addended by: Arby Barrette on: 08/25/2017 09:05 AM   Modules accepted: Orders

## 2017-08-27 LAB — HEPATITIS B SURFACE ANTIGEN: Hepatitis B Surface Ag: NONREACTIVE

## 2017-08-27 LAB — HEPATITIS B SURFACE ANTIBODY, QUANTITATIVE: Hepatitis B-Post: <5 m[IU]/mL — ABNORMAL LOW (ref >=10)

## 2017-08-27 LAB — HEPATITIS C ANTIBODY
Hepatitis C Ab: NONREACTIVE
SIGNAL TO CUT-OFF: 0.01

## 2017-09-24 ENCOUNTER — Ambulatory Visit: Payer: Medicare Other | Admitting: Family Medicine

## 2017-10-11 DIAGNOSIS — J019 Acute sinusitis, unspecified: Secondary | ICD-10-CM | POA: Diagnosis not present

## 2017-10-11 DIAGNOSIS — R42 Dizziness and giddiness: Secondary | ICD-10-CM | POA: Diagnosis not present

## 2017-10-11 DIAGNOSIS — J029 Acute pharyngitis, unspecified: Secondary | ICD-10-CM | POA: Diagnosis not present

## 2017-10-13 ENCOUNTER — Ambulatory Visit (INDEPENDENT_AMBULATORY_CARE_PROVIDER_SITE_OTHER): Payer: Medicare Other | Admitting: Family Medicine

## 2017-10-13 ENCOUNTER — Encounter: Payer: Self-pay | Admitting: Family Medicine

## 2017-10-13 DIAGNOSIS — J0100 Acute maxillary sinusitis, unspecified: Secondary | ICD-10-CM

## 2017-10-13 DIAGNOSIS — J019 Acute sinusitis, unspecified: Secondary | ICD-10-CM | POA: Insufficient documentation

## 2017-10-13 DIAGNOSIS — I10 Essential (primary) hypertension: Secondary | ICD-10-CM | POA: Diagnosis not present

## 2017-10-13 DIAGNOSIS — J329 Chronic sinusitis, unspecified: Secondary | ICD-10-CM | POA: Insufficient documentation

## 2017-10-13 DIAGNOSIS — F320 Major depressive disorder, single episode, mild: Secondary | ICD-10-CM

## 2017-10-13 MED ORDER — OLMESARTAN MEDOXOMIL 20 MG PO TABS: 20.0000 mg | ORAL_TABLET | Freq: Every day | ORAL | 1 refills | Status: DC

## 2017-10-13 MED ORDER — DOXYCYCLINE HYCLATE 100 MG PO TABS: 100.0000 mg | ORAL_TABLET | Freq: Two times a day (BID) | ORAL | 0 refills | Status: DC

## 2017-10-13 NOTE — Assessment & Plan Note (Signed)
Refill of Benicar given.  Blood pressures well controlled.

## 2017-10-13 NOTE — Progress Notes (Signed)
Tommi Rumps, MD Phone: [phone]  Robin Becker is a 67 y.o. female who presents today for same day.  CC: sinusitis  Patient notes starting a little over a week ago she had right ear pain with some right ear itching.  She subsequently developed facial discomfort and congestion with some cough.  She also had vertigo symptoms that would occur when she would move her head in a certain direction.  She felt a little feverish on Sunday.  She went to the walk-in clinic 2 days ago and was diagnosed with BPPV and sinusitis.  She was started on Augmentin and Flonase.  She has noted a little bit of fatigue.  Notes a hunger sensation in her stomach since starting on the Augmentin.  She has had some nausea as well.  No diarrhea.  No vomiting.  No blood in her stool.  She is having normal bowel movements.  She notes the dizziness has improved.  Patient notes issues with falling asleep.  She notes this has been going on for some time.  She wonders about taking melatonin for this.  She does note some depression that started around the time her husband was diagnosed with cancer.  She notes no SI.  Social History   Tobacco Use  Smoking Status Current Every Day Smoker  . Packs/day: 0.25  . Types: Cigarettes  Smokeless Tobacco Never Used     ROS see history of present illness  Objective  Physical Exam Vitals:   10/13/17 1601  BP: 110/68  Pulse: 72  Temp: 98.4 F (36.9 C)  SpO2: 97%    BP Readings from Last 3 Encounters:  10/13/17 110/68  08/18/17 118/68  09/29/16 138/84   Wt Readings from Last 3 Encounters:  10/13/17 157 lb 9.6 oz (71.5 kg)  08/18/17 157 lb 12.8 oz (71.6 kg)  09/29/16 159 lb 12.8 oz (72.5 kg)    Physical Exam  Constitutional: No distress.  HENT:  Head: Normocephalic and atraumatic.  Mouth/Throat: Oropharynx is clear and moist.  Normal TMs bilaterally  Eyes: Pupils are equal, round, and reactive to light. Conjunctivae are normal.  Neck: Neck supple.    Cardiovascular: Normal rate, regular rhythm and normal heart sounds.  Pulmonary/Chest: Effort normal and breath sounds normal.  Abdominal: Soft. Bowel sounds are normal. She exhibits no distension. There is no tenderness.  Musculoskeletal: She exhibits no edema.  Lymphadenopathy:    She has no cervical adenopathy.  Neurological: She is alert.  Skin: Skin is warm and dry. She is not diaphoretic.     Assessment/Plan: Please see individual problem list.  Sinusitis Patient seems to not be tolerating the Augmentin.  I suspect her upset stomach and nausea are related to the Augmentin.  I suspect her fatigue and tiredness is related to her illness.  I relayed this to the patient.  She has a benign abdominal exam.  Benign other exam.  We will switch her to doxycycline.  If her symptoms do not improve she will let us know.  Given return precautions.  Depression, major, single episode, mild ([place]) I suspect this is reactive to her husband's cancer diagnosis and having to care for him.  I discussed possible medication or therapy for this though she declined.  She will use melatonin for sleep.  Given return precautions.  Hypertension Refill of Benicar given.  Blood pressures well controlled.  No orders of the defined types were placed in this encounter.   Meds ordered this encounter  Medications  . doxycycline (VIBRA-TABS) 100  MG tablet    Sig: Take 1 tablet (100 mg total) by mouth 2 (two) times daily.    Dispense:  10 tablet    Refill:  0  . olmesartan (BENICAR) 20 MG tablet    Sig: Take 1 tablet (20 mg total) by mouth daily.    Dispense:  90 tablet    Refill:  1     Tommi Rumps, MD [place] [facility] [place]

## 2017-10-13 NOTE — Assessment & Plan Note (Signed)
Patient seems to not be tolerating the Augmentin.  I suspect her upset stomach and nausea are related to the Augmentin.  I suspect her fatigue and tiredness is related to her illness.  I relayed this to the patient.  She has a benign abdominal exam.  Benign other exam.  We will switch her to doxycycline.  If her symptoms do not improve she will let us know.  Given return precautions.

## 2017-10-13 NOTE — Assessment & Plan Note (Signed)
I suspect this is reactive to her husband's cancer diagnosis and having to care for him.  I discussed possible medication or therapy for this though she declined.  She will use melatonin for sleep.  Given return precautions.

## 2017-10-13 NOTE — Patient Instructions (Signed)
Nice to see you. I suspect you feel fatigued related to your recent illness.  We will change you from Augmentin to doxycycline to see if that helps with the sensation in your stomach. If your symptoms are not improving with this change please let us know. If you feel more depressed please let us know. If you would like to start on medication for depression you could let us know.  You can take melatonin for your sleep.  If you develop thoughts of harming yourself please seek medical attention immediately.

## 2017-11-04 ENCOUNTER — Other Ambulatory Visit: Payer: Self-pay | Admitting: Family Medicine

## 2017-11-12 DIAGNOSIS — L301 Dyshidrosis [pompholyx]: Secondary | ICD-10-CM | POA: Diagnosis not present

## 2017-12-08 ENCOUNTER — Other Ambulatory Visit: Payer: Self-pay

## 2017-12-08 ENCOUNTER — Emergency Department
Admission: EM | Admit: 2017-12-08 | Discharge: 2017-12-08 | Disposition: A | Discharge status: Home / Self Care | Payer: Medicare Other | Attending: Student in an Organized Health Care Education/Training Program | Admitting: Student in an Organized Health Care Education/Training Program

## 2017-12-08 DIAGNOSIS — R21 Rash and other nonspecific skin eruption: Secondary | ICD-10-CM | POA: Insufficient documentation

## 2017-12-08 DIAGNOSIS — Z79899 Other long term (current) drug therapy: Secondary | ICD-10-CM | POA: Insufficient documentation

## 2017-12-08 DIAGNOSIS — F17210 Nicotine dependence, cigarettes, uncomplicated: Secondary | ICD-10-CM | POA: Diagnosis not present

## 2017-12-08 DIAGNOSIS — I10 Essential (primary) hypertension: Secondary | ICD-10-CM | POA: Diagnosis not present

## 2017-12-08 MED ORDER — HYDROXYZINE HCL 10 MG PO TABS: 10.0000 mg | ORAL_TABLET | Freq: Three times a day (TID) | ORAL | 0 refills | Status: DC | PRN

## 2017-12-08 MED ORDER — TRIAMCINOLONE ACETONIDE 0.5 % EX CREA: 1.0000 "application " | TOPICAL_CREAM | Freq: Three times a day (TID) | CUTANEOUS | 0 refills | Status: DC

## 2017-12-08 NOTE — Discharge Instructions (Addendum)
Follow-up with your regular doctor if not better in 3 to 5 days.  Keep your appointment with your dermatologist.  Your blood tests that we ran today for syphilis will not be back until tomorrow.  We ordered this is the rashes on the hands and feet and this is typical of this disease.

## 2017-12-08 NOTE — ED Provider Notes (Signed)
[facility] Emergency Department Provider Note  ____________________________________________   First MD Initiated Contact with Patient 12/08/17 1011     (approximate)  I have reviewed the triage vital signs and the nursing notes.   HISTORY  Chief Complaint Rash    HPI Jakelyn Squyres is a 67 y.o. female presents emergency department complaining of a rash on her hands and feet that is been there for a year.  She states it has been very itchy and will scratch until the skin changes color.  She states her doctor told her she had vitiligo but she states this color does come back after she stopped scratching.  She has been using triamcinolone cream 0.1% with very mild relief    Past Medical History:  Diagnosis Date  . Allergy    Seasonal  . Chicken pox   . Depression   . GERD (gastroesophageal reflux disease)   . Glaucoma    s/p surgery  . History of chicken pox   . History of pneumonia 2013   [facility]  . Hypertension   . Prediabetes 2011   h/o A1c 6.5%  . Seasonal allergies   . Smoking     Patient Active Problem List   Diagnosis Date Noted  . Sinusitis 10/13/2017  . Depression, major, single episode, mild ([place]) 10/13/2017  . Allergic dermatitis 09/29/2016  . Vision disorder 09/29/2016  . Hypertension 12/18/2015  . OA (osteoarthritis) 12/18/2015  . Diabetes ([place]) 12/18/2015  . Abscess of vulva 06/22/2015  . Hypopigmented skin lesion 12/02/2014  . New onset of headaches after age 55   . GERD (gastroesophageal reflux disease)   . Seasonal allergies   . HLD (hyperlipidemia)   . Smoking     Past Surgical History:  Procedure Laterality Date  . CATARACT EXTRACTION W/PHACO Right 08/27/2015   Procedure: CATARACT EXTRACTION PHACO AND INTRAOCULAR LENS PLACEMENT (IOC);  Surgeon: Birder Robson, MD;  Location: [facility] ORS;  Service: Ophthalmology;  Laterality: Right;  [place] 00:53 AP% 20.1 CDE 10.80 fluid pack lot # [id number] [place]    . COLONOSCOPY      Prior to Admission medications   Medication Sig Start Date End Date Taking? Authorizing Provider  fexofenadine (ALLEGRA ALLERGY) 180 MG tablet Take 1 tablet (180 mg total) by mouth at bedtime. As needed 08/18/17   McLean-Scocuzza, Nino Glow, MD  fluticasone (FLONASE) 50 MCG/ACT nasal spray Place 2 sprays into both nostrils daily. 08/18/17   McLean-Scocuzza, Nino Glow, MD  hydrOXYzine (ATARAX/VISTARIL) 10 MG tablet Take 1 tablet (10 mg total) by mouth 3 (three) times daily as needed. 12/08/17   Delvecchio Madole, Linden Dolin, PA-C  olmesartan (BENICAR) 20 MG tablet Take 1 tablet (20 mg total) by mouth daily. 10/13/17   Leone Haven, MD  triamcinolone cream (KENALOG) 0.5 % Apply 1 application topically 3 (three) times daily. 12/08/17   Caryn Section Linden Dolin, PA-C    Allergies Lipitor [atorvastatin calcium]  Family History  Problem Relation Age of Onset  . Heart disease Mother   . Stroke Mother   . Cancer Sister 3       colon and otherwise  . Stroke Maternal Aunt   . Coronary artery disease Cousin   . Diabetes Neg Hx   . Breast cancer Neg Hx     Social History Social History   Tobacco Use  . Smoking status: Current Every Day Smoker    Packs/day: 0.25    Types: Cigarettes  . Smokeless tobacco: Never Used  Substance Use Topics  . Alcohol use: Yes    Alcohol/week: 0.0 oz    Comment: Regular on weekends  . Drug use: No    Review of Systems  Constitutional: No fever/chills Eyes: No visual changes. ENT: No sore throat. Respiratory: Denies cough Genitourinary: Negative for dysuria. Musculoskeletal: Negative for back pain. Skin: Positive for rash.    ____________________________________________   PHYSICAL EXAM:  VITAL SIGNS: ED Triage Vitals [12/08/17 0923]  Enc Vitals Group     BP (!) 143/60     Pulse Rate 71     Resp 18     Temp 97.8 F (36.6 C)     Temp Source Oral     SpO2 98 %     Weight 158 lb (71.7 kg)     Height 5\' 4"  (1.626 m)     Head Circumference       Peak Flow      Pain Score 0     Pain Loc      Pain Edu?      Excl. in [place]?     Constitutional: Alert and oriented. Well appearing and in no acute distress. Eyes: Conjunctivae are normal.  Head: Atraumatic. Nose: No congestion/rhinnorhea. Mouth/Throat: Mucous membranes are moist.   Neck:  supple no lymphadenopathy noted Cardiovascular: Normal rate, regular rhythm. Heart sounds are normal Respiratory: Normal respiratory effort.  No retractions, lungs c t a  Abd: soft nontender bs normal all 4 quad GU: deferred Musculoskeletal: FROM all extremities, warm and well perfused Neurologic:  Normal speech and language.  Skin:  Skin is warm, dry and intact.  Rash noted on the palms of the hands the sides of the ankles and the soles of the feet.  Some is scaly.  Almost like psoriasis.   Psychiatric: Mood and affect are normal. Speech and behavior are normal.  ____________________________________________   LABS (all labs ordered are listed, but only abnormal results are displayed)  Labs Reviewed  RPR   ____________________________________________   ____________________________________________  RADIOLOGY    ____________________________________________   PROCEDURES  Procedure(s) performed: No  Procedures    ____________________________________________   INITIAL IMPRESSION / ASSESSMENT AND PLAN / ED COURSE  Pertinent labs & imaging results that were available during my care of the patient were reviewed by me and considered in my medical decision making (see chart for details).   Patient is a 67 year old female presents emergency department complaining of a rash on her hands and feet for over a year.  States areas are very itchy.  Triamcinolone cream 0.1% without any relief.  Physical exam patient appears well.  She does have a rash noted on the palms of her hands, soles of her feet, and at the side of the ankles.  There is some vitiligo noted also.  Due to the  concerns of the patient having a rash on the palms and soles of her feet I ordered an RPR.  Explained to the patient this test would not be back for a few days.  She was given a prescription of triamcinolone cream 0.5%.  She is to use this medication and keep her appointment with her dermatologist.  She states she understands will comply.  She is discharged stable condition     As part of my medical decision making, I reviewed the following data within the [place] notes reviewed and incorporated, Old chart reviewed, Notes from prior ED visits and  Controlled Substance Database  ____________________________________________   FINAL CLINICAL IMPRESSION(S) /  ED DIAGNOSES  Final diagnoses:  Rash and nonspecific skin eruption      NEW MEDICATIONS STARTED DURING THIS VISIT:  Discharge Medication List as of 12/08/2017 10:20 AM    START taking these medications   Details  hydrOXYzine (ATARAX/VISTARIL) 10 MG tablet Take 1 tablet (10 mg total) by mouth 3 (three) times daily as needed., Starting Wed 12/08/2017, Normal    triamcinolone cream (KENALOG) 0.5 % Apply 1 application topically 3 (three) times daily., Starting Wed 12/08/2017, Normal         Note:  This document was prepared using Dragon voice recognition software and may include unintentional dictation errors.    Versie Starks, PA-C 12/08/17 1727    Merlyn Lot, MD 12/10/17 [phone]

## 2017-12-08 NOTE — ED Triage Notes (Signed)
Pt c/o itchy rash on her hands and feet for over a year, states she has an appt with dermatologist next month.

## 2017-12-09 LAB — RPR: RPR: NONREACTIVE

## 2017-12-10 NOTE — ED Notes (Addendum)
Patient called looking for results of bloodwork done during visit.  Called patient back on home phone [phone]). Paitient not available.  Left message with husband.  Called patient's cell phone.  Unable to get through, only received a busy signal.

## 2017-12-13 ENCOUNTER — Other Ambulatory Visit: Payer: Self-pay

## 2017-12-20 ENCOUNTER — Ambulatory Visit (INDEPENDENT_AMBULATORY_CARE_PROVIDER_SITE_OTHER): Payer: Medicare Other | Admitting: Family Medicine

## 2017-12-20 ENCOUNTER — Encounter: Payer: Self-pay | Admitting: Family Medicine

## 2017-12-20 VITALS — BP 122/70 | HR 75 | Temp 97.9°F | Resp 18 | Ht 64.0 in | Wt 153.0 lb

## 2017-12-20 DIAGNOSIS — R21 Rash and other nonspecific skin eruption: Secondary | ICD-10-CM

## 2017-12-20 DIAGNOSIS — L299 Pruritus, unspecified: Secondary | ICD-10-CM | POA: Diagnosis not present

## 2017-12-20 MED ORDER — DIPHENHYDRAMINE HCL 50 MG PO TABS: 50.0000 mg | ORAL_TABLET | Freq: Every evening | ORAL | 0 refills | Status: DC | PRN

## 2017-12-20 MED ORDER — TRIAMCINOLONE ACETONIDE 0.5 % EX OINT: 1.0000 "application " | TOPICAL_OINTMENT | Freq: Two times a day (BID) | CUTANEOUS | 0 refills | Status: DC

## 2017-12-20 NOTE — Progress Notes (Signed)
   Subjective:    Patient ID: Robin Becker, female    DOB: 11-09-1950, 67 y.o.   MRN: [id number]  HPI  Presents to clinic c/o itchy rash on hands and feet for over a year.   She has tried many different OTC creams to help, with no relief. Went to ER on 12/08/17 and was treated with Kenalog cream and hydroxyzine with some relief of itch. Has upcoming appt with dermatology in a week - 12/27/17.  She has a hx of vitiligo on legs/ankles.   Denies fever or chills. Denies any new soaps, lotions, detergents.    Review of Systems   Constitutional: Negative for chills, fatigue and fever.  HENT: Negative for congestion, ear pain, sinus pain and sore throat.   Eyes: Negative.   Respiratory: Negative for cough, shortness of breath and wheezing.   Cardiovascular: Negative for chest pain, palpitations and leg swelling.  Gastrointestinal: Negative for abdominal pain, diarrhea, nausea and vomiting.  Genitourinary: Negative for dysuria, frequency and urgency.  Musculoskeletal: Negative for arthralgias and myalgias.  Skin: Negative for color change, pallor. Positive for rash - bilateral palms of hands and soles of feet Neurological: Negative for syncope, light-headedness and headaches.  Psychiatric/Behavioral: The patient is not nervous/anxious.    Objective:   Physical Exam  Constitutional: She is oriented to person, place, and time. She appears well-developed and well-nourished. No distress.  HENT:  Head: Normocephalic and atraumatic.  Cardiovascular: Normal rate, regular rhythm and normal heart sounds.  Pulmonary/Chest: Effort normal and breath sounds normal. No respiratory distress.  Neurological: She is alert and oriented to person, place, and time.  Gait normal  Skin: Skin is warm and dry. Rash noted. No pallor.  Rash on bilateral palms of hands and bilateral balls of feet, some on ankles. Some of rash is scaly - similar to a psoriasis rash. Also has patches of lightening in skin on  ankles/shins consistent with vitiligo.   Psychiatric: She has a normal mood and affect. Her behavior is normal. Thought content normal.  Nursing note and vitals reviewed.  Vitals:   12/20/17 0930  BP: 122/70  Pulse: 75  Resp: 18  Temp: 97.9 F (36.6 C)  SpO2: 96%      Assessment & Plan:    Eruption- patient will use Kenalog ointment on rash areas.  Advised to use only a thin layer twice a day.  She will keep appointment with dermatology as scheduled on Monday 8/19.  Itch - patient advised to take Allegra every morning and can use Benadryl at bedtime as needed to help with itching/allow patient to get some sleep.  Encouraged patient to avoid scratching skin as much as possible as this could open up skin for risk of cellulitis infection.  Follow up if symptoms persist or worsen.

## 2017-12-20 NOTE — Patient Instructions (Signed)
Great to meet you!  Keep appt with Dermatology  Use Kenalog cream & dose of benadryl at night to help calm itching

## 2017-12-27 DIAGNOSIS — L400 Psoriasis vulgaris: Secondary | ICD-10-CM | POA: Diagnosis not present

## 2017-12-27 DIAGNOSIS — L853 Xerosis cutis: Secondary | ICD-10-CM | POA: Diagnosis not present

## 2018-01-27 DIAGNOSIS — L400 Psoriasis vulgaris: Secondary | ICD-10-CM | POA: Diagnosis not present

## 2018-02-09 ENCOUNTER — Ambulatory Visit (INDEPENDENT_AMBULATORY_CARE_PROVIDER_SITE_OTHER): Payer: Self-pay | Admitting: Family Medicine

## 2018-02-09 ENCOUNTER — Encounter: Payer: Self-pay | Admitting: Family Medicine

## 2018-02-09 VITALS — BP 150/98 | HR 77 | Temp 98.5°F | Ht 60.0 in | Wt 152.0 lb

## 2018-02-09 DIAGNOSIS — R21 Rash and other nonspecific skin eruption: Secondary | ICD-10-CM

## 2018-02-09 DIAGNOSIS — L501 Idiopathic urticaria: Secondary | ICD-10-CM

## 2018-02-09 DIAGNOSIS — I10 Essential (primary) hypertension: Secondary | ICD-10-CM

## 2018-02-09 DIAGNOSIS — L299 Pruritus, unspecified: Secondary | ICD-10-CM

## 2018-02-09 LAB — CBC
HCT: 41.6 % (ref 36.0–46.0)
Hemoglobin: 13.7 g/dL (ref 12.0–15.0)
MCHC: 33 g/dL (ref 30.0–36.0)
MCV: 83.9 fl (ref 78.0–100.0)
PLATELETS: 286 10*3/uL (ref 150.0–400.0)
RBC: 4.95 Mil/uL (ref 3.87–5.11)
RDW: 14.5 % (ref 11.5–15.5)
WBC: 8 10*3/uL (ref 4.0–10.5)

## 2018-02-09 LAB — COMPREHENSIVE METABOLIC PANEL
ALBUMIN: 4.2 g/dL (ref 3.5–5.2)
ALT: 14 U/L (ref 0–35)
AST: 17 U/L (ref 0–37)
Alkaline Phosphatase: 94 U/L (ref 39–117)
BUN: 15 mg/dL (ref 6–23)
CHLORIDE: 105 meq/L (ref 96–112)
CO2: 30 mEq/L (ref 19–32)
CREATININE: 0.68 mg/dL (ref 0.40–1.20)
Calcium: 9.9 mg/dL (ref 8.4–10.5)
GFR: 91.17 mL/min (ref >60.00)
Glucose, Bld: 115 mg/dL — ABNORMAL HIGH (ref 70–99)
Potassium: 4.7 mEq/L (ref 3.5–5.1)
SODIUM: 141 meq/L (ref 135–145)
TOTAL PROTEIN: 6.9 g/dL (ref 6.0–8.3)
Total Bilirubin: 0.4 mg/dL (ref 0.2–1.2)

## 2018-02-09 MED ORDER — FEXOFENADINE HCL 180 MG PO TABS: 360.0000 mg | ORAL_TABLET | Freq: Every day | ORAL | 1 refills | Status: DC

## 2018-02-09 MED ORDER — TRIAMCINOLONE ACETONIDE 0.5 % EX OINT: 1.0000 "application " | TOPICAL_OINTMENT | Freq: Two times a day (BID) | CUTANEOUS | 0 refills | Status: DC

## 2018-02-09 NOTE — Patient Instructions (Signed)
Keep log of BP readings,  1 in AM and 1 in PM for next week and call us with readings

## 2018-02-09 NOTE — Progress Notes (Signed)
Subjective:    Patient ID: Robin Becker, female    DOB: 01/23/1951, 67 y.o.   MRN: [id number]  HPI   Patient presents to clinic with continued complaints of itching all over and also patchy rash on palms of hands and bottoms of feet.  Patient has been dealing with this for over 6 months.  She has seen dermatology who prescribed her clobetasol propionate cream, but patient states this cream was very expensive and only minimally effective.  Patient states she will itch so much that she keeps her husband awake at night.  Patient also reports her blood pressure has been up and down recently.  States she checked it a few days ago and was 170/90, then the next day states she felt sort of tired, check blood pressure was 111/52.  Patient has been on Benicar for approximately 4 years, and per patient her blood pressure is decently been controlled on this medication.   Patient Active Problem List   Diagnosis Date Noted  . Sinusitis 10/13/2017  . Depression, major, single episode, mild ([place]) 10/13/2017  . Allergic dermatitis 09/29/2016  . Vision disorder 09/29/2016  . Hypertension 12/18/2015  . OA (osteoarthritis) 12/18/2015  . Diabetes ([place]) 12/18/2015  . Abscess of vulva 06/22/2015  . Hypopigmented skin lesion 12/02/2014  . New onset of headaches after age 23   . GERD (gastroesophageal reflux disease)   . Seasonal allergies   . HLD (hyperlipidemia)   . Smoking    Social History   Tobacco Use  . Smoking status: Current Every Day Smoker    Packs/day: 0.25    Types: Cigarettes  . Smokeless tobacco: Never Used  Substance Use Topics  . Alcohol use: Yes    Alcohol/week: 0.0 standard drinks    Comment: Regular on weekends   Review of Systems  Constitutional: Negative for chills, fatigue and fever.  HENT: Negative for congestion, ear pain, sinus pain and sore throat.   Eyes: Negative.   Respiratory: Negative for cough, shortness of breath and wheezing.   Cardiovascular: Negative for  chest pain, palpitations and leg swelling.  Gastrointestinal: Negative for abdominal pain, diarrhea, nausea and vomiting.  Genitourinary: Negative for dysuria, frequency and urgency.  Musculoskeletal: Negative for arthralgias and myalgias.  Skin: Rash on hands/feet, itching all over Neurological: Negative for syncope, light-headedness and headaches.  Psychiatric/Behavioral: The patient is not nervous/anxious.       Objective:   Physical Exam  Constitutional: She is oriented to person, place, and time. She appears well-nourished. No distress.  HENT:  Head: Normocephalic and atraumatic.  Eyes: EOM are normal. No scleral icterus.  Neck: No tracheal deviation present.  Cardiovascular: Normal rate, regular rhythm and normal heart sounds.  No carotid bruit  Pulmonary/Chest: Effort normal and breath sounds normal. No respiratory distress.  Musculoskeletal: She exhibits no edema.  Gait normal  Neurological: She is alert and oriented to person, place, and time.  Skin: Skin is warm and dry. Rash noted. She is not diaphoretic.  Rash on bilateral palms of hands and bilateral balls of feet, some on ankles. Some of rash is scaly - similar to a psoriasis rash.  Also has patches of lightening in skin on ankles/shins consistent with vitiligo.   Psychiatric: She has a normal mood and affect. Her behavior is normal.  Nursing note and vitals reviewed.     Vitals:   02/09/18 1001  BP: (!) 150/98  Pulse: 77  Temp: 98.5 F (36.9 C)  SpO2: 97%    Assessment &  Plan:   Itching/chronic idiopathic urticaria/eruption of skin on hands & feet --we will have patient take high-dose Allegra 360 mg once daily to calm down chronic itching.  Patient given refill of triamcinolone ointment to use on red patchy areas on palms of hands and feet, states this medication was more effective than the clobetasol prescribed by dermatology.  We will also get a CBC/CMP to rule out other possible underlying causes of itching  including liver/kidney issues  Essential hypertension-patient had been controlled on Benicar for years.  More recently blood pressures have been high and low.  Patient will keep a log of blood pressure readings twice per day for next week and call us with reading results.  Pending results of blood pressure log, we will assess need for blood pressure medication adjustments.  Patient will follow-up in 2 weeks for complete physical exam and follow-up on blood pressure and itching.

## 2018-02-22 ENCOUNTER — Other Ambulatory Visit: Payer: Self-pay | Admitting: Family Medicine

## 2018-02-22 DIAGNOSIS — I10 Essential (primary) hypertension: Secondary | ICD-10-CM

## 2018-02-22 MED ORDER — OLMESARTAN MEDOXOMIL 20 MG PO TABS
20.0000 mg | ORAL_TABLET | Freq: Every day | ORAL | 1 refills | Status: DC
Start: 2018-02-22 — End: 2021-06-12

## 2018-02-23 DIAGNOSIS — Z6829 Body mass index (BMI) 29.0-29.9, adult: Secondary | ICD-10-CM | POA: Diagnosis not present

## 2018-02-23 DIAGNOSIS — Z79899 Other long term (current) drug therapy: Secondary | ICD-10-CM | POA: Diagnosis not present

## 2018-02-23 DIAGNOSIS — L209 Atopic dermatitis, unspecified: Secondary | ICD-10-CM | POA: Diagnosis not present

## 2018-02-23 DIAGNOSIS — F17218 Nicotine dependence, cigarettes, with other nicotine-induced disorders: Secondary | ICD-10-CM | POA: Diagnosis not present

## 2018-02-23 DIAGNOSIS — F418 Other specified anxiety disorders: Secondary | ICD-10-CM | POA: Diagnosis not present

## 2018-02-23 DIAGNOSIS — Z636 Dependent relative needing care at home: Secondary | ICD-10-CM | POA: Diagnosis not present

## 2018-02-23 DIAGNOSIS — I10 Essential (primary) hypertension: Secondary | ICD-10-CM | POA: Diagnosis not present

## 2018-02-23 DIAGNOSIS — Z0001 Encounter for general adult medical examination with abnormal findings: Secondary | ICD-10-CM | POA: Diagnosis not present

## 2018-03-15 ENCOUNTER — Ambulatory Visit: Payer: Self-pay

## 2018-03-15 ENCOUNTER — Other Ambulatory Visit: Payer: Self-pay

## 2018-03-15 ENCOUNTER — Emergency Department: Payer: Medicare HMO

## 2018-03-15 ENCOUNTER — Encounter: Payer: Self-pay | Admitting: Emergency Medicine

## 2018-03-15 ENCOUNTER — Emergency Department
Admission: EM | Admit: 2018-03-15 | Discharge: 2018-03-15 | Disposition: A | Discharge status: Home / Self Care | Payer: Medicare HMO | Attending: Emergency Medicine | Admitting: Emergency Medicine

## 2018-03-15 DIAGNOSIS — E119 Type 2 diabetes mellitus without complications: Secondary | ICD-10-CM | POA: Diagnosis not present

## 2018-03-15 DIAGNOSIS — R079 Chest pain, unspecified: Secondary | ICD-10-CM

## 2018-03-15 DIAGNOSIS — R51 Headache: Secondary | ICD-10-CM | POA: Diagnosis present

## 2018-03-15 DIAGNOSIS — I10 Essential (primary) hypertension: Secondary | ICD-10-CM | POA: Diagnosis not present

## 2018-03-15 DIAGNOSIS — J019 Acute sinusitis, unspecified: Secondary | ICD-10-CM | POA: Diagnosis not present

## 2018-03-15 DIAGNOSIS — Z79899 Other long term (current) drug therapy: Secondary | ICD-10-CM | POA: Diagnosis not present

## 2018-03-15 DIAGNOSIS — F17210 Nicotine dependence, cigarettes, uncomplicated: Secondary | ICD-10-CM | POA: Diagnosis not present

## 2018-03-15 LAB — BASIC METABOLIC PANEL
ANION GAP: 6 (ref 5–15)
BUN: 12 mg/dL (ref 8–23)
CO2: 27 mmol/L (ref 22–32)
Calcium: 8.9 mg/dL (ref 8.9–10.3)
Chloride: 112 mmol/L — ABNORMAL HIGH (ref 98–111)
Creatinine, Ser: 0.66 mg/dL (ref 0.44–1.00)
GLUCOSE: 132 mg/dL — AB (ref 70–99)
POTASSIUM: 3.9 mmol/L (ref 3.5–5.1)
Sodium: 145 mmol/L (ref 135–145)

## 2018-03-15 LAB — CBC
HCT: 40.2 % (ref 36.0–46.0)
HEMOGLOBIN: 12.8 g/dL (ref 12.0–15.0)
MCH: 27.4 pg (ref 26.0–34.0)
MCHC: 31.8 g/dL (ref 30.0–36.0)
MCV: 86.1 fL (ref 80.0–100.0)
NRBC: 0 % (ref 0.0–0.2)
PLATELETS: 279 10*3/uL (ref 150–400)
RBC: 4.67 MIL/uL (ref 3.87–5.11)
RDW: 14.4 % (ref 11.5–15.5)
WBC: 9.9 10*3/uL (ref 4.0–10.5)

## 2018-03-15 LAB — TROPONIN I

## 2018-03-15 MED ORDER — PREDNISONE 20 MG PO TABS
60.0000 mg | ORAL_TABLET | Freq: Once | ORAL | Status: AC
  Administered 2018-03-15: 60 mg via ORAL
  Filled 2018-03-15: qty 3

## 2018-03-15 MED ORDER — NITROGLYCERIN 0.4 MG SL SUBL
SUBLINGUAL_TABLET | SUBLINGUAL | Status: AC
  Filled 2018-03-15: qty 1

## 2018-03-15 MED ORDER — NITROGLYCERIN 0.4 MG SL SUBL
0.4000 mg | SUBLINGUAL_TABLET | SUBLINGUAL | Status: DC | PRN
  Administered 2018-03-15: 0.4 mg via SUBLINGUAL

## 2018-03-15 MED ORDER — PREDNISONE 50 MG PO TABS: ORAL_TABLET | ORAL | 0 refills | Status: DC

## 2018-03-15 NOTE — ED Triage Notes (Signed)
Pt reports that she developed pain in her jaw, tongue and neck, along with chest pain. She states that she was nauseated. Denies SOB or diapheresis.

## 2018-03-15 NOTE — ED Triage Notes (Signed)
First Nurse Note:  C/O chest pain x 1 day.  Also c/o sore throat, bilateral ear pain, headache, and jaw pain.  Blood pressure was elevated as well.  AAOx3.  Skin warm and dry. NAD

## 2018-03-15 NOTE — Telephone Encounter (Signed)
Pt called with C/O chest pain radiating up into her neck and ear. Pt states it started last night and kept her awake. Today it has come and gone. Pt has checked her BP and reports last night BP 169/84 Hr 75. Today BP 134/74 Hr 78.  She denies SOB She states she is dizzy and has some nausea. She states that she has been told she is pre diabetic and has been told she has high cholesterol. She has recently lost 10-11 lbs with diet changes. Per protocol pt will be see in the ED. Her husband will drive. Care advice read to patient Pt verbalized understanding of all instructions.  Reason for Disposition . [1] Intermittent  chest pain or "angina" AND [2] increasing in severity or frequency  (Exception: pains lasting a few seconds)  Answer Assessment - Initial Assessment Questions 1. LOCATION: "Where does it hurt?"       Chest to neck and ear and tongue 2. RADIATION: "Does the pain go anywhere else?" (e.g., into neck, jaw, arms, back)     Neck  BP 134/74 hr 78  last night 169/84 HR 75 3. ONSET: "When did the chest pain begin?" (Minutes, hours or days)      Last night   4. PATTERN "Does the pain come and go, or has it been constant since it started?"  "Does it get worse with exertion?"      comes and goes  5. DURATION: "How long does it last" (e.g., seconds, minutes, hours)     About till 2am took Ibuprofen 6. SEVERITY: "How bad is the pain?"  (e.g., Scale 1-10; mild, moderate, or severe)    - MILD (1-3): doesn't interfere with normal activities     - MODERATE (4-7): interferes with normal activities or awakens from sleep    - SEVERE (8-10): excruciating pain, unable to do any normal activities       severe 7. CARDIAC RISK FACTORS: "Do you have any history of heart problems or risk factors for heart disease?" (e.g., prior heart attack, angina; high blood pressure, diabetes, being overweight, high cholesterol, smoking, or strong family history of heart disease)     High BP, pre diabetic High colesterol  recently lost weight 10-11 lbs 8. PULMONARY RISK FACTORS: "Do you have any history of lung disease?"  (e.g., blood clots in lung, asthma, emphysema, birth control pills)      Emphysema years ago 21. CAUSE: "What do you think is causing the chest pain?"     sinus 10. OTHER SYMPTOMS: "Do you have any other symptoms?" (e.g., dizziness, nausea, vomiting, sweating, fever, difficulty breathing, cough)       Dizziness, nausea 11. PREGNANCY: "Is there any chance you are pregnant?" "When was your last menstrual period?"       N/A  Protocols used: CHEST PAIN-A-AH

## 2018-03-15 NOTE — ED Provider Notes (Signed)
[facility] Emergency Department Provider Note  ___________________________________________   First MD Initiated Contact with Patient 03/15/18 1933     (approximate)  I have reviewed the triage vital signs and the nursing notes.   HISTORY  Chief Complaint Chest Pain   HPI Robin Becker is a 67 y.o. female history of hypertension with strong family history of heart disease was presented to the emergency department today complaining of neck pain as well as bilateral headache and pressure to her ears bilaterally.  Says that she feels pressure to the sides of her nose as well as she has felt previously with sinus infections.  She says that the pressure increases when she sits back in bed.  However, she states that there is no pain or pressure when she is walking.  Denies any shortness of breath with exertion or chest pain with exertion.  Says that she sometimes has a chest pain in the center of her chest for just a second or 2 but again this is not worsened with exertion.  There is no radiation to the back or to the arms.  Patient denies a history of heart disease.  Patient also says that she feels this heaviness/pressure in her tongue.   Past Medical History:  Diagnosis Date  . Allergy    Seasonal  . Chicken pox   . Depression   . GERD (gastroesophageal reflux disease)   . Glaucoma    s/p surgery  . History of chicken pox   . History of pneumonia 2013   [facility]  . Hypertension   . Prediabetes 2011   h/o A1c 6.5%  . Seasonal allergies   . Smoking     Patient Active Problem List   Diagnosis Date Noted  . Sinusitis 10/13/2017  . Depression, major, single episode, mild ([place]) 10/13/2017  . Allergic dermatitis 09/29/2016  . Vision disorder 09/29/2016  . Essential hypertension 12/18/2015  . OA (osteoarthritis) 12/18/2015  . Diabetes ([place]) 12/18/2015  . Abscess of vulva 06/22/2015  . Hypopigmented skin lesion 12/02/2014  . New onset of headaches after  age 35   . GERD (gastroesophageal reflux disease)   . Seasonal allergies   . HLD (hyperlipidemia)   . Smoking     Past Surgical History:  Procedure Laterality Date  . CATARACT EXTRACTION W/PHACO Right 08/27/2015   Procedure: CATARACT EXTRACTION PHACO AND INTRAOCULAR LENS PLACEMENT (IOC);  Surgeon: Birder Robson, MD;  Location: [facility] ORS;  Service: Ophthalmology;  Laterality: Right;  [place] 00:53 AP% 20.1 CDE 10.80 fluid pack lot # [id number] [place]  . COLONOSCOPY      Prior to Admission medications   Medication Sig Start Date End Date Taking? Authorizing Provider  fexofenadine (ALLEGRA ALLERGY) 180 MG tablet Take 2 tablets (360 mg total) by mouth at bedtime. 02/09/18   Guse, Jacquelynn Cree, FNP  fluticasone (FLONASE) 50 MCG/ACT nasal spray Place 2 sprays into both nostrils daily. 08/18/17   McLean-Scocuzza, Nino Glow, MD  olmesartan (BENICAR) 20 MG tablet Take 1 tablet (20 mg total) by mouth daily. 02/22/18   Leone Haven, MD  triamcinolone ointment (KENALOG) 0.5 % Apply 1 application topically 2 (two) times daily. 02/09/18   Jodelle Green, FNP    Allergies Lipitor [atorvastatin calcium]  Family History  Problem Relation Age of Onset  . Heart disease Mother   . Stroke Mother   . Cancer Sister 56       colon and otherwise  . Stroke Maternal  Aunt   . Coronary artery disease Cousin   . Diabetes Neg Hx   . Breast cancer Neg Hx     Social History Social History   Tobacco Use  . Smoking status: Current Every Day Smoker    Packs/day: 0.25    Types: Cigarettes  . Smokeless tobacco: Never Used  Substance Use Topics  . Alcohol use: Yes    Alcohol/week: 0.0 standard drinks    Comment: Regular on weekends  . Drug use: No    Review of Systems  Constitutional: No fever/chills Eyes: No visual changes. ENT: No sore throat. Cardiovascular: As above Respiratory: Denies shortness of breath. Gastrointestinal: No abdominal pain.  No nausea, no vomiting.  No  diarrhea.  No constipation. Genitourinary: Negative for dysuria. Musculoskeletal: Negative for back pain. Skin: Negative for rash. Neurological: Negative for headaches, focal weakness or numbness.   ____________________________________________   PHYSICAL EXAM:  VITAL SIGNS: ED Triage Vitals  Enc Vitals Group     BP 03/15/18 1815 (!) 135/52     Pulse Rate 03/15/18 1815 71     Resp 03/15/18 1815 20     Temp 03/15/18 1815 98.2 F (36.8 C)     Temp Source 03/15/18 1815 Oral     SpO2 03/15/18 1815 97 %     Weight 03/15/18 1816 150 lb (68 kg)     Height 03/15/18 1816 5' (1.524 m)     Head Circumference --      Peak Flow --      Pain Score 03/15/18 1815 3     Pain Loc --      Pain Edu? --      Excl. in [place]? --     Constitutional: Alert and oriented. Well appearing and in no acute distress. Eyes: Conjunctivae are normal.  Head: Atraumatic.  Normal TMs, bilaterally.  No claudication.  No tenderness nor is there any nodularity the bilateral distribution of the temporal arteries. Nose: No congestion/rhinnorhea. Mouth/Throat: Mucous membranes are moist.  No pharyngeal erythema.  No exudate to the bilateral tonsils. Neck: No stridor.  No tenderness to palpation to the anterior neck nor the posterior neck.  No lymphadenopathy. Cardiovascular: Normal rate, regular rhythm. Grossly normal heart sounds.  Good peripheral circulation with equal and bilateral radial pulses. Respiratory: Normal respiratory effort.  No retractions. Lungs CTAB. Gastrointestinal: Soft and nontender. No distention. No CVA tenderness. Musculoskeletal: No lower extremity tenderness nor edema.  No joint effusions. Neurologic:  Normal speech and language. No gross focal neurologic deficits are appreciated. Skin:  Skin is warm, dry and intact.  Psychiatric: Mood and affect are normal. Speech and behavior are normal.  ____________________________________________   LABS (all labs ordered are listed, but only abnormal  results are displayed)  Labs Reviewed  BASIC METABOLIC PANEL - Abnormal; Notable for the following components:      Result Value   Chloride 112 (*)    Glucose, Bld 132 (*)    All other components within normal limits  CBC  TROPONIN I  TROPONIN I   ____________________________________________  EKG  ED ECG REPORT I, Doran Stabler, the attending physician, personally viewed and interpreted this ECG.   Date: 03/15/2018  EKG Time: 1812  Rate: 76  Rhythm: normal sinus rhythm  Axis: Normal  Intervals:none  ST&T Change: No ST segment elevation or depression.  No abnormal T wave inversion.  ____________________________________________  RADIOLOGY  X-ray without acute process. ____________________________________________   PROCEDURES  Procedure(s) performed:   Procedures  Critical Care  performed:   ____________________________________________   INITIAL IMPRESSION / ASSESSMENT AND PLAN / ED COURSE  Pertinent labs & imaging results that were available during my care of the patient were reviewed by me and considered in my medical decision making (see chart for details).  DDX: Sinusitis, tension headache, ACS, giant cell arteritis, As part of my medical decision making, I reviewed the following data within the [facility] Notes from prior ED visits  ----------------------------------------- 9:57 PM on 03/15/2018 -----------------------------------------  Second troponin negative.  Patient still chest pain-free.  After steroids she says that her symptoms are almost completely relieved.  Patient will be discharged with a course of steroids.  Vague symptoms with mostly upper respiratory with most likely diagnosis being sinusitis.  Patient was given a nitroglycerin because she told me she was having 5 out of 10 pain.  However, when the nurse gave her the medicine she said that she was having no chest pain at that time.  Therefore there is no change with the  nitro.  However, after the steroids the patient's headache/pressure is relieved.  Because of the patient's vague chest pain as well as neck pain and tongue heaviness she will be referred to cardiology.  Reassuring lab work-up regarding her troponins as well as normal EKG.  However, patient with risk factors including age, hypertension and family history of heart disease.  She is understanding of the diagnosis as well as treatment and willing to comply.  Knows to follow-up within 48 to 72 hours with cardiology.  Will return for any worsening or concerning symptoms. ____________________________________________   FINAL CLINICAL IMPRESSION(S) / ED DIAGNOSES  Sinusitis.  Chest pain.  NEW MEDICATIONS STARTED DURING THIS VISIT:  New Prescriptions   No medications on file     Note:  This document was prepared using Dragon voice recognition software and may include unintentional dictation errors.     Orbie Pyo, MD 03/15/18 2158

## 2018-03-16 NOTE — Telephone Encounter (Signed)
Sent to PCP as an FYI   Pt will be going to ED

## 2018-03-16 NOTE — Telephone Encounter (Signed)
Patient already seen in ED.

## 2018-04-04 ENCOUNTER — Other Ambulatory Visit: Payer: Self-pay | Admitting: Family Medicine

## 2018-04-04 DIAGNOSIS — I10 Essential (primary) hypertension: Secondary | ICD-10-CM

## 2018-05-03 ENCOUNTER — Ambulatory Visit (INDEPENDENT_AMBULATORY_CARE_PROVIDER_SITE_OTHER): Payer: Medicare HMO | Admitting: Family Medicine

## 2018-05-03 ENCOUNTER — Encounter: Payer: Self-pay | Admitting: Family Medicine

## 2018-05-03 DIAGNOSIS — R21 Rash and other nonspecific skin eruption: Secondary | ICD-10-CM | POA: Diagnosis not present

## 2018-05-03 DIAGNOSIS — L299 Pruritus, unspecified: Secondary | ICD-10-CM

## 2018-05-03 MED ORDER — DIPHENHYDRAMINE HCL 50 MG PO TABS: 50.0000 mg | ORAL_TABLET | Freq: Every evening | ORAL | 2 refills | Status: DC | PRN

## 2018-05-03 MED ORDER — HYDROXYZINE HCL 10 MG PO TABS: 10.0000 mg | ORAL_TABLET | Freq: Three times a day (TID) | ORAL | 2 refills | Status: DC | PRN

## 2018-05-03 NOTE — Progress Notes (Signed)
Subjective:    Patient ID: Robin Becker, female    DOB: 10-05-1950, 67 y.o.   MRN: [id number]  HPI  Presents to clinic c/o patchy dry rash on hands & feet and continued itching.  Patient states she was using a Kenalog ointment, does not help somewhat to dry up rash on hands, but it became too expensive.  Patient also did see dermatology, but states she can no longer afford the specialist co-pays.  Patient states she has started to put a layer of petroleum jelly on hands and feet at night to help control itching and dry patches, this seems to work quite well.  Also patient in the past was prescribed Benadryl to take at bedtime as needed for itching, states the Benadryl has been the most effective for her overnight so she can get sleep and not itch all night.  Patient also uses hydroxyzine during the daytime on occasion as needed for when she is having a day where her itching is worse.   Patient Active Problem List   Diagnosis Date Noted  . Sinusitis 10/13/2017  . Depression, major, single episode, mild ([place]) 10/13/2017  . Allergic dermatitis 09/29/2016  . Vision disorder 09/29/2016  . Essential hypertension 12/18/2015  . OA (osteoarthritis) 12/18/2015  . Diabetes ([place]) 12/18/2015  . Abscess of vulva 06/22/2015  . Hypopigmented skin lesion 12/02/2014  . New onset of headaches after age 74   . GERD (gastroesophageal reflux disease)   . Seasonal allergies   . HLD (hyperlipidemia)   . Smoking    Social History   Tobacco Use  . Smoking status: Current Every Day Smoker    Packs/day: 0.25    Types: Cigarettes  . Smokeless tobacco: Never Used  Substance Use Topics  . Alcohol use: Yes    Alcohol/week: 0.0 standard drinks    Comment: Regular on weekends   Review of Systems  Constitutional: Negative for chills, fatigue and fever.  HENT: Negative for congestion, ear pain, sinus pain and sore throat.   Eyes: Negative.   Respiratory: Negative for cough, shortness of breath and  wheezing.   Cardiovascular: Negative for chest pain, palpitations and leg swelling.  Gastrointestinal: Negative for abdominal pain, diarrhea, nausea and vomiting.  Genitourinary: Negative for dysuria, frequency and urgency.  Musculoskeletal: Negative for arthralgias and myalgias.  Skin: Dry patchy rash on hands & feet Neurological: Negative for syncope, light-headedness and headaches.  Psychiatric/Behavioral: The patient is not nervous/anxious.       Objective:   Physical Exam Vitals signs and nursing note reviewed.  Constitutional:      Appearance: Normal appearance.  HENT:     Head: Normocephalic and atraumatic.     Mouth/Throat:     Mouth: Mucous membranes are moist.  Eyes:     General: No scleral icterus.    Extraocular Movements: Extraocular movements intact.  Cardiovascular:     Rate and Rhythm: Normal rate and regular rhythm.  Pulmonary:     Effort: Pulmonary effort is normal. No respiratory distress.     Breath sounds: Normal breath sounds. No wheezing or rales.  Skin:    General: Skin is warm and dry.     Coloration: Skin is not pale.     Findings: Rash present.     Comments: Rash on bilateral palms of hands and bilateral balls of feet, some on ankles. Some of rash is scaly - similar to a psoriasis rash. It does appear softer and there is less skin flaking than at previous visit.  Neurological:     Mental Status: She is alert and oriented to person, place, and time.  Psychiatric:        Mood and Affect: Mood normal.        Behavior: Behavior normal.       Vitals:   05/03/18 0816  BP: 110/66  Pulse: 73  Temp: 98.3 F (36.8 C)  SpO2: 94%   Assessment & Plan:   Itching, rash on hands and feet - patient will continue to use Benadryl at bedtime as needed to help calm itching so she can get some sleep.  She will also use hydroxyzine 10 mg during the day as needed for itching breakthrough.  She will continue taking Allegra every day in the a.m.  Patient is aware  to not take hydroxyzine and Benadryl at the same time as they both can cause drowsiness.  Patient encouraged to continue using Vaseline on hands and feet as this has been very effective for her and also is inexpensive.  Patient will keep regularly scheduled follow-up as planned with PCP.  Advised return to clinic sooner if any issues arise.

## 2018-05-03 NOTE — Patient Instructions (Signed)
DO NOT TAKE HYDROXYZINE and BENADRYL together at the same time

## 2018-06-02 ENCOUNTER — Other Ambulatory Visit: Payer: Self-pay

## 2018-06-02 ENCOUNTER — Telehealth: Payer: Self-pay | Admitting: Family Medicine

## 2018-06-02 MED ORDER — ACCU-CHEK SOFT TOUCH LANCETS MISC: 12 refills | Status: AC

## 2018-06-02 MED ORDER — GLUCOSE BLOOD VI STRP: ORAL_STRIP | 12 refills | Status: AC

## 2018-06-02 MED ORDER — ACCU-CHEK AVIVA PLUS W/DEVICE KIT: 1.0000 | PACK | 0 refills | Status: AC | PRN

## 2018-06-02 NOTE — Telephone Encounter (Signed)
I have sent to [facility] for patient.

## 2018-06-02 NOTE — Telephone Encounter (Signed)
Copied from [place]. Topic: Quick Communication - See Telephone Encounter >> Jun 02, 2018  3:48 PM Bea Graff, NT wrote: CRM for notification. See Telephone encounter for: 06/02/18. Desiree with [facility] calling to request an rx for Accu Check Aviva plus meter, test strips and lancets to be sent to them for this pt. [place], [place] [phone] (Phone) [phone] (Fax)

## 2018-06-02 NOTE — Telephone Encounter (Signed)
It is okay to send this in for the patient.

## 2018-08-01 ENCOUNTER — Other Ambulatory Visit: Payer: Self-pay | Admitting: Family Medicine

## 2018-08-02 ENCOUNTER — Other Ambulatory Visit: Payer: Self-pay

## 2018-08-02 DIAGNOSIS — L299 Pruritus, unspecified: Secondary | ICD-10-CM

## 2018-08-02 MED ORDER — HYDROXYZINE HCL 10 MG PO TABS: 10.0000 mg | ORAL_TABLET | Freq: Three times a day (TID) | ORAL | 1 refills | Status: DC | PRN

## 2018-10-06 ENCOUNTER — Ambulatory Visit (INDEPENDENT_AMBULATORY_CARE_PROVIDER_SITE_OTHER): Payer: Medicare HMO | Admitting: Family Medicine

## 2018-10-06 ENCOUNTER — Other Ambulatory Visit: Payer: Self-pay

## 2018-10-06 VITALS — BP 130/74 | HR 90 | Temp 98.3°F | Resp 18 | Ht 61.0 in | Wt 155.8 lb

## 2018-10-06 DIAGNOSIS — M62838 Other muscle spasm: Secondary | ICD-10-CM

## 2018-10-06 DIAGNOSIS — M79601 Pain in right arm: Secondary | ICD-10-CM

## 2018-10-06 DIAGNOSIS — E119 Type 2 diabetes mellitus without complications: Secondary | ICD-10-CM | POA: Diagnosis not present

## 2018-10-06 MED ORDER — METHYLPREDNISOLONE ACETATE 40 MG/ML IJ SUSP
40.0000 mg | Freq: Once | INTRAMUSCULAR | Status: AC
  Administered 2018-10-06: 11:00:00 40 mg via INTRAMUSCULAR

## 2018-10-06 MED ORDER — KETOROLAC TROMETHAMINE 60 MG/2ML IM SOLN
60.0000 mg | Freq: Once | INTRAMUSCULAR | Status: AC
  Administered 2018-10-06: 11:00:00 60 mg via INTRAMUSCULAR

## 2018-10-06 MED ORDER — CYCLOBENZAPRINE HCL 5 MG PO TABS: 5.0000 mg | ORAL_TABLET | Freq: Three times a day (TID) | ORAL | 1 refills | Status: DC | PRN

## 2018-10-06 NOTE — Progress Notes (Signed)
Subjective:    Patient ID: Robin Becker, female    DOB: 16-Oct-1950, 68 y.o.   MRN: [id number]  HPI   Patient presents to clinic with pain in right arm after lifting up large jug of water, ?pulled muscle.   States she notices the pain in her right upper arm especially when having to reach forward or when reaching arm back to put on her jacket.  Denies weakness in the extremities.  Denies neck or back pain.    Patient Active Problem List   Diagnosis Date Noted  . Sinusitis 10/13/2017  . Depression, major, single episode, mild ([place]) 10/13/2017  . Allergic dermatitis 09/29/2016  . Vision disorder 09/29/2016  . Essential hypertension 12/18/2015  . OA (osteoarthritis) 12/18/2015  . Diabetes ([place]) 12/18/2015  . Abscess of vulva 06/22/2015  . Hypopigmented skin lesion 12/02/2014  . New onset of headaches after age 46   . GERD (gastroesophageal reflux disease)   . Seasonal allergies   . HLD (hyperlipidemia)   . Smoking    Social History   Tobacco Use  . Smoking status: Current Every Day Smoker    Packs/day: 0.25    Types: Cigarettes  . Smokeless tobacco: Never Used  Substance Use Topics  . Alcohol use: Yes    Alcohol/week: 0.0 standard drinks    Comment: Regular on weekends   Review of Systems  Constitutional: Negative for chills, fatigue and fever.  HENT: Negative for congestion, ear pain, sinus pain and sore throat.   Eyes: Negative.   Respiratory: Negative for cough, shortness of breath and wheezing.   Cardiovascular: Negative for chest pain, palpitations and leg swelling.  Gastrointestinal: Negative for abdominal pain, diarrhea, nausea and vomiting.  Genitourinary: Negative for dysuria, frequency and urgency.  Musculoskeletal: +right arm pain Skin: Negative for color change, pallor and rash.  Neurological: Negative for syncope, light-headedness and headaches.  Psychiatric/Behavioral: The patient is not nervous/anxious.       Objective:   Physical Exam  Vitals signs and nursing note reviewed.  Constitutional:      General: She is not in acute distress.    Appearance: She is normal weight. She is not ill-appearing, toxic-appearing or diaphoretic.  HENT:     Head: Normocephalic and atraumatic.  Cardiovascular:     Rate and Rhythm: Normal rate and regular rhythm.     Pulses: Normal pulses.  Pulmonary:     Effort: Pulmonary effort is normal. No respiratory distress.     Breath sounds: Normal breath sounds.  Musculoskeletal:       Arms:     Comments: Location of pain indicated by red mark on diagram  ROM of shoulder, elbow, wrist, hand intact  Pulling pain in the muscles of upper arm with reaching straight forward and backward.   Skin:    General: Skin is warm and dry.     Coloration: Skin is not jaundiced or pale.     Findings: No erythema.  Neurological:     Mental Status: She is alert and oriented to person, place, and time.     Motor: No weakness.     Gait: Gait normal.  Psychiatric:        Mood and Affect: Mood normal.        Behavior: Behavior normal.    Vitals:   10/06/18 1050  BP: 130/74  Pulse: 90  Resp: 18  Temp: 98.3 F (36.8 C)  SpO2: 96%      Assessment & Plan:   Right arm pain/muscle  pain in right arm --suspect patient pulled muscle and right upper arm when lifting heavy water jug.  We will give IM Toradol and prednisone in clinic x1.  She will use Flexeril as needed for any muscle spasm.  Advised to put a heating pad on sore muscle to help soothe muscle when she gets home.  Advised to do daily range of motion stretching exercises of arm to help keep self from becoming too stiff.  Administrations This Visit    ketorolac (TORADOL) injection 60 mg    Admin Date 10/06/2018 Action Given Dose 60 mg Route Intramuscular Administered By Neta Ehlers, RMA       methylPREDNISolone acetate (DEPO-MEDROL) injection 40 mg    Admin Date 10/06/2018 Action Given Dose 40 mg Route Intramuscular Administered By  Neta Ehlers, RMA          Patient will keep regularly scheduled follow-up PCP as planned.  Advised to return to clinic sooner if any issues arise.

## 2018-10-06 NOTE — Patient Instructions (Signed)
Distensin muscular  Muscle Strain  Una distensin muscular es una lesin que se produce cuando un msculo se estira ms all de su largo normal. Puede ocurrir durante cadas, mientras se practican deportes o se levantan objetos. A causa de una distensin, pueden rasgarse las fibras musculares. En general, la recuperacin de una distensin muscular tarda de 1 a 2semanas. La recuperacin completa normalmente tarda de 5 a 6semanas.  Inicialmente, se trata con terapia PRICE (proteccin, reposo, hielo, compresin, elevacin). Esto implica:   Proteger al msculo para que no sufra nuevas lesiones.   Dejar en reposo el msculo lesionado.   Aplicar hielo en el msculo lesionado.   Aplicar presin (compresin) en el msculo lesionado. Esto se puede hacer con una frula o una venda elstica.   Levantar (elevar) el msculo lesionado.  Adems, el mdico puede recomendarle analgsicos.  Siga estas indicaciones en su casa:  Si tiene una frula:   Use la frula segn las indicaciones del mdico. Qutesela solamente como se lo haya indicado el mdico.   Afloje la frula si los dedos de la mano o de los pies se le adormecen, si siente hormigueo o si se le enfran y se tornan de color azul.   Mantenga la frula limpia.   Si la frula no es impermeable:  ? No deje que se moje.  ? Cbrala con un envoltorio hermtico cuando tome un bao de inmersin o una ducha.  Control del dolor, la rigidez y la hinchazn     Si se lo indican, aplique hielo en el lugar de la lesin.  ? Si tiene una frula desmontable, qutesela como se lo haya indicado el mdico.  ? Ponga el hielo en una bolsa plstica.  ? Coloque una toalla entre la piel y la bolsa de hielo.  ? Coloque el hielo durante 20minutos, 2 a 3veces por da.   Mueva los dedos de las manos o de los pies con frecuencia. Esto ayuda a evitar el entumecimiento y disminuir la hinchazn.   Cuando est sentado o acostado, mantenga el lugar lesionado por encima del nivel del  corazn.   Use una venda elstica segn las indicaciones del mdico. Asegrese de no ajustarla demasiado.  Instrucciones generales   Tome los medicamentos de venta libre y los recetados solamente como se lo haya indicado el mdico.   Limite las actividades. Deje en reposo el msculo lesionado como se lo haya indicado el mdico. El mdico puede decirle que los movimientos suaves no representan un problema.   Si le indicaron fisioterapia, haga los ejercicios como se lo haya indicado el mdico.   No ejerza presin en ninguna parte de la frula hasta que se haya endurecido por completo. Esto puede tardar muchas horas.   No consuma ningn producto que contenga nicotina o tabaco, como cigarrillos y cigarrillos electrnicos. Estos pueden retrasar la consolidacin del hueso. Si necesita ayuda para dejar de fumar, consulte al mdico.   Entre en calor antes de hacer ejercicio. Esto ayuda a prevenir futuras distensiones musculares.   Consulte al mdico cundo puede volver a conducir si tiene una frula.   Concurra a todas las visitas de seguimiento como se lo haya indicado el mdico. Esto es importante.  Comunquese con un mdico si:   Siente ms dolor o tiene ms hinchazn en el lugar de la lesin.  Solicite ayuda de inmediato si:   Si tiene alguno de estos problemas en el lugar de la lesin:  ? Siente entumecimiento.  ? Siente hormigueos.  ?   Pierde mucha fuerza.  Resumen   Una distensin muscular es una lesin que se produce cuando un msculo se estira ms all de su largo normal.   Inicialmente, se trata con terapia PRICE (proteccin, reposo, hielo, compresin, elevacin). Esto implica proteger la lesin, aplicar hielo y presin, levantar el rea y dejarla en reposo.   Limite las actividades. Deje en reposo el msculo lesionado como se lo haya indicado el mdico. El mdico puede decirle que los movimientos suaves no representan un problema.   Entre en calor antes de hacer ejercicio. Esto ayuda a prevenir  futuras distensiones musculares.  Esta informacin no tiene como fin reemplazar el consejo del mdico. Asegrese de hacerle al mdico cualquier pregunta que tenga.  Document Released: 07/24/2008 Document Revised: 01/25/2017 Document Reviewed: 01/25/2017  Elsevier Interactive Patient Education  2019 Elsevier Inc[employer]

## 2019-01-12 DIAGNOSIS — R0989 Other specified symptoms and signs involving the circulatory and respiratory systems: Secondary | ICD-10-CM | POA: Diagnosis not present

## 2019-01-12 DIAGNOSIS — R51 Headache: Secondary | ICD-10-CM | POA: Diagnosis not present

## 2019-01-12 DIAGNOSIS — R5383 Other fatigue: Secondary | ICD-10-CM | POA: Diagnosis not present

## 2019-01-12 DIAGNOSIS — Z03818 Encounter for observation for suspected exposure to other biological agents ruled out: Secondary | ICD-10-CM | POA: Diagnosis not present

## 2019-01-12 DIAGNOSIS — J069 Acute upper respiratory infection, unspecified: Secondary | ICD-10-CM | POA: Diagnosis not present

## 2019-01-23 DIAGNOSIS — H524 Presbyopia: Secondary | ICD-10-CM | POA: Diagnosis not present

## 2019-02-22 DIAGNOSIS — Z23 Encounter for immunization: Secondary | ICD-10-CM | POA: Diagnosis not present

## 2019-03-31 DIAGNOSIS — K5900 Constipation, unspecified: Secondary | ICD-10-CM | POA: Diagnosis not present

## 2019-04-04 ENCOUNTER — Other Ambulatory Visit: Payer: Self-pay | Admitting: Family Medicine

## 2019-05-08 DIAGNOSIS — H2512 Age-related nuclear cataract, left eye: Secondary | ICD-10-CM | POA: Diagnosis not present

## 2019-07-25 DIAGNOSIS — Z961 Presence of intraocular lens: Secondary | ICD-10-CM | POA: Diagnosis not present

## 2019-07-25 DIAGNOSIS — H40039 Anatomical narrow angle, unspecified eye: Secondary | ICD-10-CM | POA: Diagnosis not present

## 2019-07-25 DIAGNOSIS — Z01818 Encounter for other preprocedural examination: Secondary | ICD-10-CM | POA: Diagnosis not present

## 2019-07-25 DIAGNOSIS — H2512 Age-related nuclear cataract, left eye: Secondary | ICD-10-CM | POA: Diagnosis not present

## 2019-07-25 DIAGNOSIS — H40033 Anatomical narrow angle, bilateral: Secondary | ICD-10-CM | POA: Diagnosis not present

## 2019-08-01 ENCOUNTER — Encounter: Admission: RE | Payer: Self-pay | Source: Home / Self Care

## 2019-08-01 ENCOUNTER — Ambulatory Visit: Admission: RE | Admit: 2019-08-01 | Payer: Medicare HMO | Source: Home / Self Care | Admitting: Ophthalmology

## 2019-08-01 SURGERY — PHACOEMULSIFICATION, CATARACT, WITH IOL INSERTION
Anesthesia: Topical | Laterality: Left

## 2019-08-08 ENCOUNTER — Encounter (INDEPENDENT_AMBULATORY_CARE_PROVIDER_SITE_OTHER): Payer: Self-pay

## 2019-08-08 ENCOUNTER — Other Ambulatory Visit: Payer: Self-pay

## 2019-08-08 ENCOUNTER — Ambulatory Visit (INDEPENDENT_AMBULATORY_CARE_PROVIDER_SITE_OTHER): Payer: Medicare HMO

## 2019-08-08 VITALS — BP 128/79 | Ht 61.0 in | Wt 155.0 lb

## 2019-08-08 DIAGNOSIS — Z0000 Encounter for general adult medical examination without abnormal findings: Secondary | ICD-10-CM | POA: Diagnosis not present

## 2019-08-08 NOTE — Patient Instructions (Addendum)
  Ms. Philpot , Thank you for taking time to come for your Medicare Wellness Visit. I appreciate your ongoing commitment to your health goals. Please review the following plan we discussed and let me know if I can assist you in the future.   These are the goals we discussed: Goals    . Follow up with Primary Care Provider     As needed       This is a list of the screening recommended for you and due dates:  Health Maintenance  Topic Date Due  . Complete foot exam   Never done  . Pneumonia vaccines (2 of 2 - PPSV23) 09/15/2016  . Tetanus Vaccine  06/12/2017  . Hemoglobin A1C  02/24/2018  . Mammogram  08/07/2020*  . Eye exam for diabetics  07/31/2020  . Colon Cancer Screening  07/24/2025  . Flu Shot  Completed  . DEXA scan (bone density measurement)  Completed  .  Hepatitis C: One time screening is recommended by Center for Disease Control  (CDC) for  adults born from [birth year] through 1967.   Completed  *Topic was postponed. The date shown is not the original due date.

## 2019-08-08 NOTE — Progress Notes (Signed)
Subjective:   Robin Becker is a 69 y.o. female who presents for an Initial Medicare Annual Wellness Visit.  Review of Systems    No ROS.  Medicare Wellness Virtual Visit.  Visual/audio telehealth visit, UTA vital signs.   Ht/Wt provided.  See social history for additional risk factors.    Cardiac Risk Factors include: advanced age (>20mn, >>20women);hypertension;diabetes mellitus     Objective:    Today's Vitals   08/08/19 1033  BP: 128/79  Weight: 155 lb (70.3 kg)  Height: 5' 1" (1.549 m)   Body mass index is 29.29 kg/m.  Advanced Directives 08/08/2019 03/15/2018 12/08/2017 05/29/2016 12/12/2015 08/27/2015 07/22/2015  Does Patient Have a Medical Advance Directive? _0  No No  Would patient like information on creating a medical advance directive? No - Patient declined No - Patient declined No - Patient declined - Yes - Educational materials given No - patient declined information -    Current Medications (verified) Outpatient Encounter Medications as of 08/08/2019  Medication Sig  . Blood Glucose Monitoring Suppl (ACCU-CHEK AVIVA PLUS) w/Device KIT 1 Device by Does not apply route as needed.  . diphenhydrAMINE (BENADRYL) 50 MG tablet Take 1 tablet (50 mg total) by mouth at bedtime as needed for itching.  . fexofenadine (ALLEGRA ALLERGY) 180 MG tablet Take 2 tablets (360 mg total) by mouth at bedtime.  . fluticasone (FLONASE) 50 MCG/ACT nasal spray Place 2 sprays into both nostrils daily.  .Marland Kitchenglucose blood (ACCU-CHEK AVIVA) test strip Used to check blood sugars once a day.  . hydrOXYzine (ATARAX/VISTARIL) 10 MG tablet Take 1 tablet (10 mg total) by mouth 3 (three) times daily as needed for itching.  . Lancets (ACCU-CHEK SOFT TOUCH) lancets Use to check blood sugars once a day.  . olmesartan (BENICAR) 20 MG tablet Take 1 tablet (20 mg total) by mouth daily.  .Marland Kitchenolmesartan (BENICAR) 20 MG tablet TAKE 1 TABLET BY MOUTH EVERY DAY  . olmesartan (BENICAR) 20 MG tablet TAKE  1 TABLET (20 MG TOTAL) BY MOUTH DAILY.  .Marland Kitchenolmesartan (BENICAR) 20 MG tablet TAKE 1 TABLET EVERY DAY  . triamcinolone ointment (KENALOG) 0.5 % Apply 1 application topically 2 (two) times daily.  . cyclobenzaprine (FLEXERIL) 5 MG tablet Take 1 tablet (5 mg total) by mouth 3 (three) times daily as needed for muscle spasms. (Patient not taking: Reported on 08/08/2019)   No facility-administered encounter medications on file as of 08/08/2019.    Allergies (verified) Lipitor [atorvastatin calcium]   History: Past Medical History:  Diagnosis Date  . Allergy    Seasonal  . Chicken pox   . Depression   . GERD (gastroesophageal reflux disease)   . Glaucoma    s/p surgery  . History of chicken pox   . History of pneumonia 2013   [facility]  . Hypertension   . Prediabetes 2011   h/o A1c 6.5%  . Seasonal allergies   . Smoking    Past Surgical History:  Procedure Laterality Date  . CATARACT EXTRACTION W/PHACO Right 08/27/2015   Procedure: CATARACT EXTRACTION PHACO AND INTRAOCULAR LENS PLACEMENT (IOC);  Surgeon: WBirder Robson MD;  Location: [facility] ORS;  Service: Ophthalmology;  Laterality: Right;  [place]:53 AP% 20.1 CDE 10.80 fluid pack lot # [id number] . COLONOSCOPY     Family History  Problem Relation Age of Onset  . Heart disease Mother   . Stroke Mother   . Cancer Sister 538  colon and otherwise  . Stroke Maternal Aunt   . Coronary artery disease Cousin   . Diabetes Neg Hx   . Breast cancer Neg Hx    Social History   Socioeconomic History  . Marital status: Married    Spouse name: Not on file  . Number of children: Not on file  . Years of education: Not on file  . Highest education level: Not on file  Occupational History  . Not on file  Tobacco Use  . Smoking status: Current Every Day Smoker    Packs/day: 0.25    Types: Cigarettes  . Smokeless tobacco: Never Used  Substance and Sexual Activity  . Alcohol use: Yes    Alcohol/week: 0.0  standard drinks    Comment: Regular on weekends  . Drug use: No  . Sexual activity: Not on file  Other Topics Concern  . Not on file  Social History Narrative   Caffeine: 2 cups coffee/day   Lives with husband and son and step son   Occupation: homemaker   Edu:   Activity: wants to restart gym.  No regular activity   Diet: good water, vegetables daily   From [place]    Social Determinants of Health   Financial Resource Strain: Low Risk   . Difficulty of Paying Living Expenses: Not hard at all  Food Insecurity: No Food Insecurity  . Worried About Charity fundraiser in the Last Year: Never true  . Ran Out of Food in the Last Year: Never true  Transportation Needs: No Transportation Needs  . Lack of Transportation (Medical): No  . Lack of Transportation (Non-Medical): No  Physical Activity: Insufficiently Active  . Days of Exercise per Week: 7 days  . Minutes of Exercise per Session: 10 min  Stress: No Stress Concern Present  . Feeling of Stress : Only a little  Social Connections: Unknown  . Frequency of Communication with Friends and Family: More than three times a week  . Frequency of Social Gatherings with Friends and Family: Three times a week  . Attends Religious Services: Not on file  . Active Member of Clubs or Organizations: Not on file  . Attends Archivist Meetings: Not on file  . Marital Status: Married    Tobacco Counseling Ready to quit: Not Answered Counseling given: Not Answered   Clinical Intake:  Pre-visit preparation completed: Yes        Diabetes: Yes(Followed by pcp)  How often do you need to have someone help you when you read instructions, pamphlets, or other written materials from your doctor or pharmacy?: 1 - Never  Interpreter Needed?: No      Activities of Daily Living In your present state of health, do you have any difficulty performing the following activities: 08/08/2019  Hearing? N  Vision? N  Difficulty  concentrating or making decisions? N  Walking or climbing stairs? N  Dressing or bathing? N  Doing errands, shopping? N  Preparing Food and eating ? N  Using the Toilet? N  In the past six months, have you accidently leaked urine? N  Do you have problems with loss of bowel control? N  Managing your Medications? N  Managing your Finances? N  Housekeeping or managing your Housekeeping? N  Some recent data might be hidden     Immunizations and Health Maintenance Immunization History  Administered Date(s) Administered  . DTaP 06/13/2007  . Influenza, High Dose Seasonal PF 01/28/2018  . Influenza-Unspecified 03/18/2015, 03/11/2016  .  Pneumococcal Polysaccharide-23 09/16/2011   Health Maintenance Due  Topic Date Due  . FOOT EXAM  Never done  . PNA vac Low Risk Adult (2 of 2 - PPSV23) 09/15/2016  . TETANUS/TDAP  06/12/2017  . HEMOGLOBIN A1C  02/24/2018    Patient Care Team: Leone Haven, MD as PCP - General (Family Medicine)  Indicate any recent Medical Services you may have received from other than [facility] providers in the past year (date may be approximate).     Assessment:   This is a routine wellness examination for [place].  Nurse connected with patient 08/08/19 at 10:30 AM EDT by a telephone enabled telemedicine application and verified that I am speaking with the correct person using two identifiers. Patient stated full name and DOB. Patient gave permission to continue with virtual visit. Patient's location was at home and Nurse's location was at [place] office.   Patient is alert and oriented x3. Patient denies difficulty focusing or concentrating. Patient likes to play games like sodoku for brain stimulation.   Health Maintenance Due: -PNA and Tdap vaccine- discussed; to be completed with doctor in visit or local pharmacy.  -Foot Exam- denies any changes, wounds, numbness, tingling. She does not walk bare foot. Followed by pcp.  -Hgb A1c- 08/25/17 (6.4) See  completed HM at the end of note.  -Mammogram- declined.  Eye: Visual acuity not assessed. Virtual visit. Followed by their ophthalmologist.  Dental: UTD  Hearing: Demonstrates normal hearing during visit.  Safety:  Patient feels safe at home- yes Patient does have smoke detectors at home- yes Patient does wear sunscreen or protective clothing when in direct sunlight - yes Patient does wear seat belt when in a moving vehicle - yes Patient drives- yes Adequate lighting in walkways free from debris- yes Grab bars and handrails used as appropriate- yes Ambulates with an assistive device- no Cell phone on person when ambulating outside of the home-yes  Social: Alcohol intake - yes      Smoking history- current Illicit drug use? none  Medication: Taking as directed and without issues.  Self managed - yes   Covid-19: Precautions and sickness symptoms discussed. Wears mask, social distancing, hand hygiene as appropriate.   Activities of Daily Living Patient denies needing assistance with: household chores, feeding themselves, getting from bed to chair, getting to the toilet, bathing/showering, dressing, managing money, or preparing meals.   Discussed the importance of a healthy diet, water intake and the benefits of aerobic exercise.   Physical activity- daily stretches 10 minutes  Diet:  Low cholesterol, vegetables; meat once weekly Water: good intake Caffeine: 2 cups of coffee  Other Providers Patient Care Team: Leone Haven, MD as PCP - General (Family Medicine)  Hearing/Vision screen  Hearing Screening   125Hz 250Hz 500Hz 1000Hz 2000Hz 3000Hz 4000Hz 6000Hz 8000Hz  Right ear:           Left ear:           Comments: Patient is able to hear conversational tones without difficulty.  No issues reported.  Vision Screening Comments: Wears corrective lenses Cataract surgery scheduled 08/18/19 Visual acuity not assessed, virtual visit.  They have seen their  ophthalmologist in the last 12 months.    Dietary issues and exercise activities discussed: Current Exercise Habits: Home exercise routine, Type of exercise: stretching, Time (Minutes): 10, Frequency (Times/Week): 7, Weekly Exercise (Minutes/Week): 70, Intensity: Mild  Goals    . Follow up with Primary Care Provider     As needed  Depression Screen PHQ 2/9 Scores 08/08/2019 10/06/2018 08/18/2017 09/29/2016 05/22/2015  PHQ - 2 Score 0 0 0 0 0  PHQ- 9 Score - 0 - - -    Fall Risk Fall Risk  08/08/2019 08/18/2017 09/29/2016 06/04/2016  Falls in the past year? 0 No No No  Follow up Falls evaluation completed;Falls prevention discussed - - -   Timed Get Up and Go Performed no, virtual visit  Cognitive Function:     6CIT Screen 08/08/2019  What Year? 0 points  What month? 0 points  What time? 0 points  Count back from 20 0 points  Months in reverse 0 points  Repeat phrase 0 points  Total Score 0    Screening Tests Health Maintenance  Topic Date Due  . FOOT EXAM  Never done  . PNA vac Low Risk Adult (2 of 2 - PPSV23) 09/15/2016  . TETANUS/TDAP  06/12/2017  . HEMOGLOBIN A1C  02/24/2018  . MAMMOGRAM  08/07/2020 (Originally 06/18/2017)  . OPHTHALMOLOGY EXAM  07/31/2020  . COLONOSCOPY  07/24/2025  . INFLUENZA VACCINE  Completed  . DEXA SCAN  Completed  . Hepatitis C Screening  Completed      Plan:   Keep all routine maintenance appointments.   Annual 09/06/19 @ 10:00   Medicare Attestation I have personally reviewed: The patient's medical and social history Their use of alcohol, tobacco or illicit drugs Their current medications and supplements The patient's functional ability including ADLs,fall risks, home safety risks, cognitive, and hearing and visual impairment Diet and physical activities Evidence for depression   I have reviewed and discussed with patient certain preventive protocols, quality metrics, and best practice recommendations.     Varney Biles, LPN   6/43/3295

## 2019-08-08 NOTE — Progress Notes (Signed)
I have reviewed the above note and agree.  Shaquoya Cosper, M.D.  

## 2019-08-10 ENCOUNTER — Ambulatory Visit: Payer: Medicare HMO | Attending: Internal Medicine

## 2019-08-10 ENCOUNTER — Other Ambulatory Visit: Payer: Self-pay

## 2019-08-10 DIAGNOSIS — Z23 Encounter for immunization: Secondary | ICD-10-CM

## 2019-08-18 DIAGNOSIS — H2512 Age-related nuclear cataract, left eye: Secondary | ICD-10-CM | POA: Diagnosis not present

## 2019-08-23 DIAGNOSIS — D3709 Neoplasm of uncertain behavior of other specified sites of the oral cavity: Secondary | ICD-10-CM | POA: Diagnosis not present

## 2019-08-23 DIAGNOSIS — Z1331 Encounter for screening for depression: Secondary | ICD-10-CM | POA: Diagnosis not present

## 2019-08-23 DIAGNOSIS — R4582 Worries: Secondary | ICD-10-CM | POA: Diagnosis not present

## 2019-08-23 DIAGNOSIS — Z0001 Encounter for general adult medical examination with abnormal findings: Secondary | ICD-10-CM | POA: Diagnosis not present

## 2019-08-23 DIAGNOSIS — R7301 Impaired fasting glucose: Secondary | ICD-10-CM | POA: Diagnosis not present

## 2019-08-23 DIAGNOSIS — E782 Mixed hyperlipidemia: Secondary | ICD-10-CM | POA: Diagnosis not present

## 2019-08-23 DIAGNOSIS — L209 Atopic dermatitis, unspecified: Secondary | ICD-10-CM | POA: Diagnosis not present

## 2019-08-23 DIAGNOSIS — F17218 Nicotine dependence, cigarettes, with other nicotine-induced disorders: Secondary | ICD-10-CM | POA: Diagnosis not present

## 2019-08-23 DIAGNOSIS — Z636 Dependent relative needing care at home: Secondary | ICD-10-CM | POA: Diagnosis not present

## 2019-08-23 DIAGNOSIS — I10 Essential (primary) hypertension: Secondary | ICD-10-CM | POA: Diagnosis not present

## 2019-09-05 ENCOUNTER — Ambulatory Visit: Payer: Medicare HMO | Attending: Internal Medicine

## 2019-09-05 DIAGNOSIS — Z23 Encounter for immunization: Secondary | ICD-10-CM

## 2019-09-05 NOTE — Progress Notes (Signed)
   Covid-19 Vaccination Clinic  Name:  Robin Becker    MRN: [id number] DOB: Nov 14, 1950  09/05/2019  [name] was observed post Covid-19 immunization for 15 minutes without incident. She was provided with Vaccine Information Sheet and instruction to access the V-Safe system.   [name] was instructed to call 911 with any severe reactions post vaccine: Marland Kitchen Difficulty breathing  . Swelling of face and throat  . A fast heartbeat  . A bad rash all over body  . Dizziness and weakness   Immunizations Administered    Name Date Dose VIS Date Route   Pfizer COVID-19 Vaccine 09/05/2019 10:20 AM 0.3 mL 07/05/2018 Intramuscular   Manufacturer: [employer], [employer]   Lot: [id number]   [place]: [id number]

## 2019-09-06 ENCOUNTER — Ambulatory Visit: Payer: Medicare HMO | Admitting: Family Medicine

## 2019-09-06 DIAGNOSIS — Z0289 Encounter for other administrative examinations: Secondary | ICD-10-CM

## 2019-11-06 DIAGNOSIS — H18711 Corneal ectasia, right eye: Secondary | ICD-10-CM | POA: Diagnosis not present

## 2019-11-08 DIAGNOSIS — G44209 Tension-type headache, unspecified, not intractable: Secondary | ICD-10-CM | POA: Diagnosis not present

## 2019-11-08 DIAGNOSIS — G4700 Insomnia, unspecified: Secondary | ICD-10-CM | POA: Diagnosis not present

## 2019-11-08 DIAGNOSIS — Z6828 Body mass index (BMI) 28.0-28.9, adult: Secondary | ICD-10-CM | POA: Diagnosis not present

## 2020-07-15 DIAGNOSIS — H8111 Benign paroxysmal vertigo, right ear: Secondary | ICD-10-CM | POA: Diagnosis not present

## 2020-07-15 DIAGNOSIS — H9313 Tinnitus, bilateral: Secondary | ICD-10-CM | POA: Diagnosis not present

## 2020-07-15 DIAGNOSIS — H903 Sensorineural hearing loss, bilateral: Secondary | ICD-10-CM | POA: Diagnosis not present

## 2020-07-15 DIAGNOSIS — H6980 Other specified disorders of Eustachian tube, unspecified ear: Secondary | ICD-10-CM | POA: Diagnosis not present

## 2020-07-15 DIAGNOSIS — J301 Allergic rhinitis due to pollen: Secondary | ICD-10-CM | POA: Diagnosis not present

## 2020-08-08 ENCOUNTER — Ambulatory Visit (INDEPENDENT_AMBULATORY_CARE_PROVIDER_SITE_OTHER): Payer: Medicare HMO

## 2020-08-08 VITALS — Ht 61.0 in | Wt 155.0 lb

## 2020-08-08 DIAGNOSIS — Z0000 Encounter for general adult medical examination without abnormal findings: Secondary | ICD-10-CM | POA: Diagnosis not present

## 2020-08-08 NOTE — Progress Notes (Signed)
Subjective:   Robin Becker is a 70 y.o. female who presents for Medicare Annual (Subsequent) preventive examination.  Review of Systems    No ROS.  Medicare Wellness Virtual Visit.    Cardiac Risk Factors include: advanced age (>62mn, >>37women);hypertension     Objective:    Today's Vitals   08/08/20 1039  Weight: 155 lb (70.3 kg)   Body mass index is 29.29 kg/m.  Advanced Directives 08/08/2020 08/08/2019 03/15/2018 12/08/2017 05/29/2016 12/12/2015 08/27/2015  Does Patient Have a Medical Advance Directive? No No No No No No No  Would patient like information on creating a medical advance directive? No - Patient declined No - Patient declined No - Patient declined No - Patient declined - Yes - Educational materials given No - patient declined information    Current Medications (verified) Outpatient Encounter Medications as of 08/08/2020  Medication Sig  . Blood Glucose Monitoring Suppl (ACCU-CHEK AVIVA PLUS) w/Device KIT 1 Device by Does not apply route as needed.  . cyclobenzaprine (FLEXERIL) 5 MG tablet Take 1 tablet (5 mg total) by mouth 3 (three) times daily as needed for muscle spasms. (Patient not taking: Reported on 08/08/2019)  . diphenhydrAMINE (BENADRYL) 50 MG tablet Take 1 tablet (50 mg total) by mouth at bedtime as needed for itching.  . fexofenadine (ALLEGRA ALLERGY) 180 MG tablet Take 2 tablets (360 mg total) by mouth at bedtime.  . fluticasone (FLONASE) 50 MCG/ACT nasal spray Place 2 sprays into both nostrils daily.  .Marland Kitchenglucose blood (ACCU-CHEK AVIVA) test strip Used to check blood sugars once a day.  . hydrOXYzine (ATARAX/VISTARIL) 10 MG tablet Take 1 tablet (10 mg total) by mouth 3 (three) times daily as needed for itching.  . Lancets (ACCU-CHEK SOFT TOUCH) lancets Use to check blood sugars once a day.  . olmesartan (BENICAR) 20 MG tablet Take 1 tablet (20 mg total) by mouth daily.  .Marland Kitchenolmesartan (BENICAR) 20 MG tablet TAKE 1 TABLET BY MOUTH EVERY DAY  . olmesartan  (BENICAR) 20 MG tablet TAKE 1 TABLET (20 MG TOTAL) BY MOUTH DAILY.  .Marland Kitchenolmesartan (BENICAR) 20 MG tablet TAKE 1 TABLET EVERY DAY  . triamcinolone ointment (KENALOG) 0.5 % Apply 1 application topically 2 (two) times daily.   No facility-administered encounter medications on file as of 08/08/2020.    Allergies (verified) Lipitor [atorvastatin calcium]   History: Past Medical History:  Diagnosis Date  . Allergy    Seasonal  . Chicken pox   . Depression   . GERD (gastroesophageal reflux disease)   . Glaucoma    s/p surgery  . History of chicken pox   . History of pneumonia 2013   [facility]  . Hypertension   . Prediabetes 2011   h/o A1c 6.5%  . Seasonal allergies   . Smoking    Past Surgical History:  Procedure Laterality Date  . CATARACT EXTRACTION W/PHACO Right 08/27/2015   Procedure: CATARACT EXTRACTION PHACO AND INTRAOCULAR LENS PLACEMENT (IOC);  Surgeon: WBirder Robson MD;  Location: [facility] ORS;  Service: Ophthalmology;  Laterality: Right;  [id number]:53 AP% 20.1 CDE 10.80 fluid pack [id number] . COLONOSCOPY     Family History  Problem Relation Age of Onset  . Heart disease Mother   . Stroke Mother   . Cancer Sister 528      colon and otherwise  . Stroke Maternal Aunt   . Coronary artery disease Cousin   . Diabetes Neg Hx   .  Breast cancer Neg Hx    Social History   Socioeconomic History  . Marital status: Married    Spouse name: Not on file  . Number of children: Not on file  . Years of education: Not on file  . Highest education level: Not on file  Occupational History  . Not on file  Tobacco Use  . Smoking status: Current Every Day Smoker    Packs/day: 0.25    Types: Cigarettes  . Smokeless tobacco: Never Used  Substance and Sexual Activity  . Alcohol use: Yes    Alcohol/week: 0.0 standard drinks    Comment: Regular on weekends  . Drug use: No  . Sexual activity: Not on file  Other Topics Concern  . Not on file  Social  History Narrative   Caffeine: 2 cups coffee/day   Lives with husband and son and step son   Occupation: homemaker   Edu:   Activity: wants to restart gym.  No regular activity   Diet: good water, vegetables daily   From [place]    Social Determinants of Health   Financial Resource Strain: Low Risk   . Difficulty of Paying Living Expenses: Not hard at all  Food Insecurity: No Food Insecurity  . Worried About Charity fundraiser in the Last Year: Never true  . Ran Out of Food in the Last Year: Never true  Transportation Needs: No Transportation Needs  . Lack of Transportation (Medical): No  . Lack of Transportation (Non-Medical): No  Physical Activity: Insufficiently Active  . Days of Exercise per Week: 7 days  . Minutes of Exercise per Session: 10 min  Stress: No Stress Concern Present  . Feeling of Stress : Only a little  Social Connections: Unknown  . Frequency of Communication with Friends and Family: More than three times a week  . Frequency of Social Gatherings with Friends and Family: Three times a week  . Attends Religious Services: Not on file  . Active Member of Clubs or Organizations: Not on file  . Attends Archivist Meetings: Not on file  . Marital Status: Married    Tobacco Counseling Ready to quit: Not Answered Counseling given: Not Answered   Clinical Intake:  Pre-visit preparation completed: Yes        Diabetes: No  How often do you need to have someone help you when you read instructions, pamphlets, or other written materials from your doctor or pharmacy?: 1 - Never    Interpreter Needed?: No      Activities of Daily Living In your present state of health, do you have any difficulty performing the following activities: 08/08/2020  Hearing? N  Vision? N  Difficulty concentrating or making decisions? N  Walking or climbing stairs? N  Dressing or bathing? N  Doing errands, shopping? N  Preparing Food and eating ? N  Using the  Toilet? N  In the past six months, have you accidently leaked urine? N  Do you have problems with loss of bowel control? N  Managing your Medications? N  Managing your Finances? N  Housekeeping or managing your Housekeeping? N  Some recent data might be hidden    Patient Care Team: Leone Haven, MD as PCP - General (Family Medicine)  Indicate any recent Medical Services you may have received from other than [facility] providers in the past year (date may be approximate).     Assessment:   This is a routine wellness examination for Robin Becker.  I connected  with Robin Becker today by telephone and verified that I am speaking with the correct person using two identifiers. Location patient: home Location provider: work Persons participating in the virtual visit: patient, Marine scientist.    I discussed the limitations, risks, security and privacy concerns of performing an evaluation and management service by telephone and the availability of in person appointments. The patient expressed understanding and verbally consented to this telephonic visit.    Interactive audio and video telecommunications were attempted between this provider and patient, however failed, due to patient having technical difficulties OR patient did not have access to video capability.  We continued and completed visit with audio only.  Some vital signs may be absent or patient reported.   Hearing/Vision screen  Hearing Screening   125Hz  250Hz  500Hz  1000Hz  2000Hz  3000Hz  4000Hz  6000Hz  8000Hz   Right ear:           Left ear:           Comments: Patient is able to hear conversational tones without difficulty.  No issues reported.  Vision Screening Comments: Wears corrective lenses Visual acuity not assessed, virtual visit.       Dietary issues and exercise activities discussed: Current Exercise Habits: Home exercise routine, Type of exercise: walking, Intensity: Mild  Regular diet  Goals    . Follow up with Primary Care Provider      As needed      Depression Screen PHQ 2/9 Scores 08/08/2020 08/08/2019 10/06/2018 08/18/2017 09/29/2016 05/22/2015  PHQ - 2 Score 1 0 0 0 0 0  PHQ- 9 Score - - 0 - - -    Fall Risk Fall Risk  08/08/2020 08/08/2019 08/18/2017 09/29/2016 06/04/2016  Falls in the past year? 0 0 No No No  Number falls in past yr: 0 - - - -  Injury with Fall? 0 - - - -  Follow up Falls evaluation completed Falls evaluation completed;Falls prevention discussed - - -    FALL RISK PREVENTION PERTAINING TO THE HOME: Handrails in use when climbing stairs?Yes Home free of loose throw rugs in walkways, pet beds, electrical cords, etc? Yes  Adequate lighting in your home to reduce risk of falls? Yes   ASSISTIVE DEVICES UTILIZED TO PREVENT FALLS: Life alert? No  Use of a cane, walker or w/c? No   TIMED UP AND GO: Was the test performed? No .   Cognitive Function:     6CIT Screen 08/08/2020 08/08/2019  What Year? 0 points 0 points  What month? 0 points 0 points  What time? 0 points 0 points  Count back from 20 0 points 0 points  Months in reverse 0 points 0 points  Repeat phrase 0 points 0 points  Total Score 0 0    Immunizations Immunization History  Administered Date(s) Administered  . DTaP 06/13/2007  . Influenza, High Dose Seasonal PF 01/28/2018  . Influenza-Unspecified 03/18/2015, 03/11/2016  . PFIZER(Purple Top)SARS-COV-2 Vaccination 08/10/2019, 09/05/2019  . Pneumococcal Polysaccharide-23 09/16/2011    TDAP status: Due, Education has been provided regarding the importance of this vaccine. Advised may receive this vaccine at local pharmacy or Health Dept. Aware to provide a copy of the vaccination record if obtained from local pharmacy or Health Dept. Verbalized acceptance and understanding. Deferred.   PNA vaccine- Advised may receive this vaccine in office, at local pharmacy or Health Dept. Aware to provide a copy of the vaccination record if obtained from local pharmacy or Health Dept.  Verbalized acceptance and understanding. Deferred.   Health  Maintenance Health Maintenance  Topic Date Due  . FOOT EXAM  Never done  . HEMOGLOBIN A1C  02/24/2018  . OPHTHALMOLOGY EXAM  07/31/2020  . INFLUENZA VACCINE  08/08/2020 (Originally 12/10/2019)  . MAMMOGRAM  09/13/2020 (Originally 06/18/2017)  . COVID-19 Vaccine (3 - Booster for Pfizer series) 09/13/2020 (Originally 03/06/2020)  . TETANUS/TDAP  08/08/2021 (Originally 06/12/2017)  . PNA vac Low Risk Adult (2 of 2 - PPSV23) 08/08/2021 (Originally 09/15/2016)  . COLONOSCOPY (Pts 45-59yr Insurance coverage will need to be confirmed)  07/24/2025  . DEXA SCAN  Completed  . Hepatitis C Screening  Completed  . HPV VACCINES  Aged Out   Mammogram- deferred per patient  Dental Screening: Recommended annual dental exams for proper oral hygiene  Community Resource Referral / Chronic Care Management: CRR required this visit?  No   CCM required this visit?  No      Plan:   Keep all routine maintenance appointments.   Follow up 09/13/20 @ 10:00  I have personally reviewed and noted the following in the patient's chart:   . Medical and social history . Use of alcohol, tobacco or illicit drugs  . Current medications and supplements . Functional ability and status . Nutritional status . Physical activity . Advanced directives . List of other physicians . Hospitalizations, surgeries, and ER visits in previous 12 months . Vitals . Screenings to include cognitive, depression, and falls . Referrals and appointments  In addition, I have reviewed and discussed with patient certain preventive protocols, quality metrics, and best practice recommendations. A written personalized care plan for preventive services as well as general preventive health recommendations were provided to patient via mail.     OVarney Biles LPN   [id number]

## 2020-08-08 NOTE — Patient Instructions (Addendum)
Robin Becker , Thank you for taking time to come for your Medicare Wellness Visit. I appreciate your ongoing commitment to your health goals. Please review the following plan we discussed and let me know if I can assist you in the future.   These are the goals we discussed: Goals    . Follow up with Primary Care Provider     As needed       This is a list of the screening recommended for you and due dates:  Health Maintenance  Topic Date Due  . Complete foot exam   Never done  . Hemoglobin A1C  02/24/2018  . Eye exam for diabetics  07/31/2020  . Flu Shot  08/08/2020*  . Mammogram  09/13/2020*  . COVID-19 Vaccine (3 - Booster for Pfizer series) 09/13/2020*  . Tetanus Vaccine  08/08/2021*  . Pneumonia vaccines (2 of 2 - PPSV23) 08/08/2021*  . Colon Cancer Screening  07/24/2025  . DEXA scan (bone density measurement)  Completed  .  Hepatitis C: One time screening is recommended by Center for Disease Control  (CDC) for  adults born from [birth year] through 1967.   Completed  . HPV Vaccine  Aged Out  *Topic was postponed. The date shown is not the original due date.    Immunizations Immunization History  Administered Date(s) Administered  . DTaP 06/13/2007  . Influenza, High Dose Seasonal PF 01/28/2018  . Influenza-Unspecified 03/18/2015, 03/11/2016  . PFIZER(Purple Top)SARS-COV-2 Vaccination 08/10/2019, 09/05/2019  . Pneumococcal Polysaccharide-23 09/16/2011   Keep all routine maintenance appointments.   Follow up 09/13/20 @ 10:00  Advanced directives: not yet completed  Conditions/risks identified: none new  Follow up in one year for your annual wellness visit    Preventive Care 65 Years and Older, Female Preventive care refers to lifestyle choices and visits with your health care provider that can promote health and wellness. What does preventive care include?  A yearly physical exam. This is also called an annual well check.  Dental exams once or twice a  year.  Routine eye exams. Ask your health care provider how often you should have your eyes checked.  Personal lifestyle choices, including:  Daily care of your teeth and gums.  Regular physical activity.  Eating a healthy diet.  Avoiding tobacco and drug use.  Limiting alcohol use.  Practicing safe sex.  Taking low-dose aspirin every day.  Taking vitamin and mineral supplements as recommended by your health care provider. What happens during an annual well check? The services and screenings done by your health care provider during your annual well check will depend on your age, overall health, lifestyle risk factors, and family history of disease. Counseling  Your health care provider may ask you questions about your:  Alcohol use.  Tobacco use.  Drug use.  Emotional well-being.  Home and relationship well-being.  Sexual activity.  Eating habits.  History of falls.  Memory and ability to understand (cognition).  Work and work Statistician.  Reproductive health. Screening  You may have the following tests or measurements:  Height, weight, and BMI.  Blood pressure.  Lipid and cholesterol levels. These may be checked every 5 years, or more frequently if you are over 67 years old.  Skin check.  Lung cancer screening. You may have this screening every year starting at age 33 if you have a 30-pack-year history of smoking and currently smoke or have quit within the past 15 years.  Fecal occult blood test (FOBT) of the stool. You  may have this test every year starting at age 7.  Flexible sigmoidoscopy or colonoscopy. You may have a sigmoidoscopy every 5 years or a colonoscopy every 10 years starting at age 49.  Hepatitis C blood test.  Hepatitis B blood test.  Sexually transmitted disease (STD) testing.  Diabetes screening. This is done by checking your blood sugar (glucose) after you have not eaten for a while (fasting). You may have this done every 1-3  years.  Bone density scan. This is done to screen for osteoporosis. You may have this done starting at age 57.  Mammogram. This may be done every 1-2 years. Talk to your health care provider about how often you should have regular mammograms. Talk with your health care provider about your test results, treatment options, and if necessary, the need for more tests. Vaccines  Your health care provider may recommend certain vaccines, such as:  Influenza vaccine. This is recommended every year.  Tetanus, diphtheria, and acellular pertussis (Tdap, Td) vaccine. You may need a Td booster every 10 years.  Zoster vaccine. You may need this after age 71.  Pneumococcal 13-valent conjugate (PCV13) vaccine. One dose is recommended after age 89.  Pneumococcal polysaccharide (PPSV23) vaccine. One dose is recommended after age 45. Talk to your health care provider about which screenings and vaccines you need and how often you need them. This information is not intended to replace advice given to you by your health care provider. Make sure you discuss any questions you have with your health care provider. Document Released: 05/24/2015 Document Revised: 01/15/2016 Document Reviewed: 02/26/2015 Elsevier Interactive Patient Education  2017 [place] Prevention in the Home Falls can cause injuries. They can happen to people of all ages. There are many things you can do to make your home safe and to help prevent falls. What can I do on the outside of my home?  Regularly fix the edges of walkways and driveways and fix any cracks.  Remove anything that might make you trip as you walk through a door, such as a raised step or threshold.  Trim any bushes or trees on the path to your home.  Use bright outdoor lighting.  Clear any walking paths of anything that might make someone trip, such as rocks or tools.  Regularly check to see if handrails are loose or broken. Make sure that both sides of any  steps have handrails.  Any raised decks and porches should have guardrails on the edges.  Have any leaves, snow, or ice cleared regularly.  Use sand or salt on walking paths during winter.  Clean up any spills in your garage right away. This includes oil or grease spills. What can I do in the bathroom?  Use night lights.  Install grab bars by the toilet and in the tub and shower. Do not use towel bars as grab bars.  Use non-skid mats or decals in the tub or shower.  If you need to sit down in the shower, use a plastic, non-slip stool.  Keep the floor dry. Clean up any water that spills on the floor as soon as it happens.  Remove soap buildup in the tub or shower regularly.  Attach bath mats securely with double-sided non-slip rug tape.  Do not have throw rugs and other things on the floor that can make you trip. What can I do in the bedroom?  Use night lights.  Make sure that you have a light by your bed that is  easy to reach.  Do not use any sheets or blankets that are too big for your bed. They should not hang down onto the floor.  Have a firm chair that has side arms. You can use this for support while you get dressed.  Do not have throw rugs and other things on the floor that can make you trip. What can I do in the kitchen?  Clean up any spills right away.  Avoid walking on wet floors.  Keep items that you use a lot in easy-to-reach places.  If you need to reach something above you, use a strong step stool that has a grab bar.  Keep electrical cords out of the way.  Do not use floor polish or wax that makes floors slippery. If you must use wax, use non-skid floor wax.  Do not have throw rugs and other things on the floor that can make you trip. What can I do with my stairs?  Do not leave any items on the stairs.  Make sure that there are handrails on both sides of the stairs and use them. Fix handrails that are broken or loose. Make sure that handrails are  as long as the stairways.  Check any carpeting to make sure that it is firmly attached to the stairs. Fix any carpet that is loose or worn.  Avoid having throw rugs at the top or bottom of the stairs. If you do have throw rugs, attach them to the floor with carpet tape.  Make sure that you have a light switch at the top of the stairs and the bottom of the stairs. If you do not have them, ask someone to add them for you. What else can I do to help prevent falls?  Wear shoes that:  Do not have high heels.  Have rubber bottoms.  Are comfortable and fit you well.  Are closed at the toe. Do not wear sandals.  If you use a stepladder:  Make sure that it is fully opened. Do not climb a closed stepladder.  Make sure that both sides of the stepladder are locked into place.  Ask someone to hold it for you, if possible.  Clearly mark and make sure that you can see:  Any grab bars or handrails.  First and last steps.  Where the edge of each step is.  Use tools that help you move around (mobility aids) if they are needed. These include:  Canes.  Walkers.  Scooters.  Crutches.  Turn on the lights when you go into a dark area. Replace any light bulbs as soon as they burn out.  Set up your furniture so you have a clear path. Avoid moving your furniture around.  If any of your floors are uneven, fix them.  If there are any pets around you, be aware of where they are.  Review your medicines with your doctor. Some medicines can make you feel dizzy. This can increase your chance of falling. Ask your doctor what other things that you can do to help prevent falls. This information is not intended to replace advice given to you by your health care provider. Make sure you discuss any questions you have with your health care provider. Document Released: 02/21/2009 Document Revised: 10/03/2015 Document Reviewed: 06/01/2014 Elsevier Interactive Patient Education  2017 [employer].

## 2020-09-06 ENCOUNTER — Other Ambulatory Visit: Payer: Self-pay

## 2020-09-13 ENCOUNTER — Other Ambulatory Visit: Payer: Self-pay

## 2020-09-13 ENCOUNTER — Encounter: Payer: Self-pay | Admitting: Family Medicine

## 2020-09-13 ENCOUNTER — Ambulatory Visit (INDEPENDENT_AMBULATORY_CARE_PROVIDER_SITE_OTHER): Payer: Medicare HMO | Admitting: Family Medicine

## 2020-09-13 DIAGNOSIS — L239 Allergic contact dermatitis, unspecified cause: Secondary | ICD-10-CM | POA: Diagnosis not present

## 2020-09-13 DIAGNOSIS — Z800 Family history of malignant neoplasm of digestive organs: Secondary | ICD-10-CM

## 2020-09-13 DIAGNOSIS — R7303 Prediabetes: Secondary | ICD-10-CM

## 2020-09-13 DIAGNOSIS — L299 Pruritus, unspecified: Secondary | ICD-10-CM

## 2020-09-13 DIAGNOSIS — R21 Rash and other nonspecific skin eruption: Secondary | ICD-10-CM

## 2020-09-13 DIAGNOSIS — I10 Essential (primary) hypertension: Secondary | ICD-10-CM

## 2020-09-13 DIAGNOSIS — E785 Hyperlipidemia, unspecified: Secondary | ICD-10-CM | POA: Diagnosis not present

## 2020-09-13 LAB — COMPREHENSIVE METABOLIC PANEL
ALT: 13 U/L (ref 0–35)
AST: 15 U/L (ref 0–37)
Albumin: 4.1 g/dL (ref 3.5–5.2)
Alkaline Phosphatase: 86 U/L (ref 39–117)
BUN: 19 mg/dL (ref 6–23)
CO2: 29 mEq/L (ref 19–32)
Calcium: 9.4 mg/dL (ref 8.4–10.5)
Chloride: 105 mEq/L (ref 96–112)
Creatinine, Ser: 0.63 mg/dL (ref 0.40–1.20)
GFR: 89.11 mL/min (ref >60.00)
Glucose, Bld: 85 mg/dL (ref 70–99)
Potassium: 3.9 mEq/L (ref 3.5–5.1)
Sodium: 142 mEq/L (ref 135–145)
Total Bilirubin: 0.4 mg/dL (ref 0.2–1.2)
Total Protein: 6.7 g/dL (ref 6.0–8.3)

## 2020-09-13 LAB — LIPID PANEL
Cholesterol: 246 mg/dL — ABNORMAL HIGH (ref 0–200)
HDL: 56.5 mg/dL (ref >39.00)
LDL Cholesterol: 158 mg/dL — ABNORMAL HIGH (ref 0–99)
NonHDL: 189.77
Total CHOL/HDL Ratio: 4
Triglycerides: 158 mg/dL — ABNORMAL HIGH (ref 0.0–149.0)
VLDL: 31.6 mg/dL (ref 0.0–40.0)

## 2020-09-13 LAB — HEMOGLOBIN A1C: Hgb A1c MFr Bld: 6.3 % (ref 4.6–6.5)

## 2020-09-13 MED ORDER — TRIAMCINOLONE ACETONIDE 0.5 % EX OINT: 1.0000 "application " | TOPICAL_OINTMENT | Freq: Two times a day (BID) | CUTANEOUS | 0 refills | Status: DC

## 2020-09-13 NOTE — Assessment & Plan Note (Signed)
Check A1c.  She will continue to work on diet and exercise.

## 2020-09-13 NOTE — Assessment & Plan Note (Signed)
Check lipid panel.  Continue diet and exercise. ?

## 2020-09-13 NOTE — Progress Notes (Signed)
Tommi Rumps, MD Phone: [phone]  Robin Becker is a 70 y.o. female who presents today for f/u. Patient was lost to follow-up for close to 3 years.  HYPERTENSION  Disease Monitoring  Home BP Monitoring notes typically similar to today Chest pain- no    Dyspnea- no Medications  Compliance-  Taking benicar.  Edema- no  Prediabetes: Most recent A1c was 6.4 though this is about 3 years ago.  She notes no polyuria or polydipsia.  She does walking for exercise.  She eats lots of salads and tries to avoid greasy fried foods.  Not much sugar intake but does drink some juice.  No soda intake.  Rash on hands and feet: Patient has seen dermatology previously.  It looks like they may have diagnosed her with psoriasis though I do not have access to their notes.  She notes the symptoms are worse during the summer and they itch.  She notes the dermatologist gave her a cream though it did not help.  Claritin and Benadryl help the most with the itching.  Social History   Tobacco Use  Smoking Status Current Every Day Smoker  . Packs/day: 0.25  . Types: Cigarettes  Smokeless Tobacco Never Used    Current Outpatient Medications on File Prior to Visit  Medication Sig Dispense Refill  . Blood Glucose Monitoring Suppl (ACCU-CHEK AVIVA PLUS) w/Device KIT 1 Device by Does not apply route as needed. 1 kit 0  . cyclobenzaprine (FLEXERIL) 5 MG tablet Take 1 tablet (5 mg total) by mouth 3 (three) times daily as needed for muscle spasms. 20 tablet 1  . diphenhydrAMINE (BENADRYL) 50 MG tablet Take 1 tablet (50 mg total) by mouth at bedtime as needed for itching. 30 tablet 2  . fexofenadine (ALLEGRA ALLERGY) 180 MG tablet Take 2 tablets (360 mg total) by mouth at bedtime. 180 tablet 1  . FLUAD QUADRIVALENT 0.5 ML injection     . fluticasone (FLONASE) 50 MCG/ACT nasal spray Place 2 sprays into both nostrils daily. 16 g 11  . FLUZONE HIGH-DOSE QUADRIVALENT 0.7 ML SUSY     . glucose blood (ACCU-CHEK AVIVA)  test strip Used to check blood sugars once a day. 50 each 12  . hydrOXYzine (ATARAX/VISTARIL) 10 MG tablet Take 1 tablet (10 mg total) by mouth 3 (three) times daily as needed for itching. 90 tablet 1  . Lancets (ACCU-CHEK SOFT TOUCH) lancets Use to check blood sugars once a day. 100 each 12  . olmesartan (BENICAR) 20 MG tablet Take 1 tablet (20 mg total) by mouth daily. 90 tablet 1  . olmesartan (BENICAR) 20 MG tablet TAKE 1 TABLET BY MOUTH EVERY DAY 90 tablet 1  . olmesartan (BENICAR) 20 MG tablet TAKE 1 TABLET (20 MG TOTAL) BY MOUTH DAILY. 90 tablet 1  . olmesartan (BENICAR) 20 MG tablet TAKE 1 TABLET EVERY DAY 30 tablet 1   No current facility-administered medications on file prior to visit.     ROS see history of present illness  Objective  Physical Exam Vitals:   09/13/20 0937  BP: 120/70  Pulse: 68  Temp: 97.9 F (36.6 C)  SpO2: 98%    BP Readings from Last 3 Encounters:  09/13/20 120/70  08/08/19 128/79  10/06/18 130/74   Wt Readings from Last 3 Encounters:  09/13/20 143 lb 12.8 oz (65.2 kg)  08/08/20 155 lb (70.3 kg)  08/08/19 155 lb (70.3 kg)    Physical Exam Constitutional:      General: She is not in  acute distress.    Appearance: She is not diaphoretic.  Cardiovascular:     Rate and Rhythm: Normal rate and regular rhythm.     Heart sounds: Normal heart sounds.  Pulmonary:     Effort: Pulmonary effort is normal.     Breath sounds: Normal breath sounds.  Skin:    General: Skin is warm and dry.     Comments: Hyperkeratotic rash on her palms and bilateral ankles, significant excoriation with depigmentation of skin on the right ankle greater than left  Neurological:     Mental Status: She is alert.      Assessment/Plan: Please see individual problem list.  Problem List Items Addressed This Visit    HLD (hyperlipidemia)    Check lipid panel.  Continue diet and exercise.      Relevant Orders   Lipid panel   Essential hypertension     Well-controlled.  She will continue olmesartan 20 mg once daily.  We will check labs.      Relevant Orders   Comp Met (CMET)   Prediabetes    Check A1c.  She will continue to work on diet and exercise.      Relevant Orders   HgB A1c   Allergic dermatitis    Patient with possible dyshidrotic eczema versus psoriasis.  I will send triamcinolone in for her.  We will refer her to a different dermatologist.      Relevant Orders   Ambulatory referral to Dermatology   Family history of colon cancer    Noted history of possible colon cancer in her sister.  Will refer to GI for colonoscopy.      Relevant Orders   Ambulatory referral to Gastroenterology    Other Visit Diagnoses    Itching       Relevant Medications   triamcinolone ointment (KENALOG) 0.5 %   Rash and nonspecific skin eruption       Relevant Medications   triamcinolone ointment (KENALOG) 0.5 %        This visit occurred during the SARS-CoV-2 public health emergency.  Safety protocols were in place, including screening questions prior to the visit, additional usage of staff PPE, and extensive cleaning of exam room while observing appropriate contact time as indicated for disinfecting solutions.    Tommi Rumps, MD [place]

## 2020-09-13 NOTE — Patient Instructions (Signed)
Nice to see you. We will get labs today and contact you with the results. GI should contact you to schedule colonoscopy. Dermatology should contact you to schedule an appointment. Please try the triamcinolone on your rash.  You can use this twice daily for up to 2 weeks at a time.  There is risk of thinning of your skin and deep pigment in your skin with excessive use.

## 2020-09-13 NOTE — Assessment & Plan Note (Signed)
Patient with possible dyshidrotic eczema versus psoriasis.  I will send triamcinolone in for her.  We will refer her to a different dermatologist.

## 2020-09-13 NOTE — Assessment & Plan Note (Signed)
Well-controlled.  She will continue olmesartan 20 mg once daily.  We will check labs.

## 2020-09-13 NOTE — Assessment & Plan Note (Signed)
Noted history of possible colon cancer in her sister.  Will refer to GI for colonoscopy.

## 2020-09-17 DIAGNOSIS — L403 Pustulosis palmaris et plantaris: Secondary | ICD-10-CM | POA: Diagnosis not present

## 2020-09-18 ENCOUNTER — Other Ambulatory Visit: Payer: Self-pay | Admitting: Family Medicine

## 2020-09-18 DIAGNOSIS — E785 Hyperlipidemia, unspecified: Secondary | ICD-10-CM

## 2020-09-18 MED ORDER — ROSUVASTATIN CALCIUM 20 MG PO TABS: 20.0000 mg | ORAL_TABLET | Freq: Every day | ORAL | 3 refills | Status: DC

## 2020-11-04 ENCOUNTER — Other Ambulatory Visit: Payer: Medicare HMO

## 2020-11-18 ENCOUNTER — Encounter: Payer: Self-pay | Admitting: Emergency Medicine

## 2020-11-18 ENCOUNTER — Other Ambulatory Visit: Payer: Self-pay

## 2020-11-18 ENCOUNTER — Ambulatory Visit
Admission: EM | Admit: 2020-11-18 | Discharge: 2020-11-18 | Disposition: A | Discharge status: Home / Self Care | Payer: Medicare HMO

## 2020-11-18 DIAGNOSIS — H6981 Other specified disorders of Eustachian tube, right ear: Secondary | ICD-10-CM | POA: Diagnosis not present

## 2020-11-18 MED ORDER — AZELASTINE HCL 0.1 % NA SOLN: 1.0000 | Freq: Two times a day (BID) | NASAL | 0 refills | Status: DC

## 2020-11-18 NOTE — ED Provider Notes (Signed)
Chief Complaint   Chief Complaint  Patient presents with   Otalgia     Subjective, HPI  Robin Becker is a very pleasant 70 y.o. female who presents with right ear pain for the last 3 days.  Patient reports having an associated right sore throat which has since resolved, but states that her right ear pain has persisted.  Patient reports having a history of ear infections.  Patient does not report any trauma to the right ear or recent illness.  She also does not report any known sick contacts.  No fever, chills, vomiting or dizziness.  Patient's problem list, past medical and social history, medications, and allergies were reviewed by me and updated in Epic.   ROS  See HPI.  Objective   Vitals:   11/18/20 0912  BP: 124/73  Pulse: 70  Resp: 18  Temp: 98.5 F (36.9 C)  SpO2: 98%     General: Appears well-developed and well-nourished. No acute distress.  HEENT Head: Normocephalic and atraumatic.  Eyes: Conjunctivae and EOM are normal. No eye drainage or scleral icterus bilaterally.  Ears: Bilateral: Hearing grossly intact. No drainage or visible deformity. No mastoid erythema, edema, or tenderness.  Right: Bulging TM noted with clear effusion beneath TM.  No erythema to TM or canal.  No drainage from TM. Left: WNL Neck: Normal range of motion, neck is supple. Cardiovascular: Normal rate  Pulm/Chest: No respiratory distress. No accessory muscle usage, speaking in full sentences.  Skin: Skin is warm and dry.    Vital signs and nursing note reviewed.    Assessment & Plan  1. Acute dysfunction of right eustachian tube  Meds ordered this encounter  Medications   azelastine (ASTELIN) 0.1 % nasal spray    Sig: Place 1 spray into both nostrils 2 (two) times daily for 7 days. Use in each nostril as directed    Dispense:  30 mL    Refill:  0    Order Specific Question:   Supervising Provider    Answer:   Chase Picket [[id number]]      70 y.o. female presents with right  ear pain for the last 3 days.  Patient reports having an associated right sore throat which has since resolved, but states that her right ear pain has persisted.  Patient reports having a history of ear infections.  Patient does not report any trauma to the right ear or recent illness.  She also does not report any known sick contacts.  No fever, chills, vomiting or dizziness.  Chart review completed.  Given symptoms along with assessment findings, likely right eustachian tube dysfunction.  Advised about home treatment care along with strict return precautions for worsening of symptoms as outlined in her AVS.  Rx'd Astelin nasal spray to patient's preferred pharmacy, but advised that this typically resolves on its own.  Stable on discharge.  Patient verbalized understanding and agreed with plan.  Plan: Discharge Instructions   None       Serafina Royals, FNP-C 11/18/20   This note was partially made with the aid of speech-to-text dictation; typographical errors are not intentional.    Serafina Royals, [place] 11/18/20 0935

## 2020-11-18 NOTE — ED Triage Notes (Signed)
Patient c/o RT ear pain x 3 days.   Patient denies fever.   Patient denies changes to hearing.   Patient endorses onset of symptoms began with pain on "right side of throat but it's gone aware now".  Patient has used prescribed ear drops ("old prescription from 2017") with no relief of symptoms.

## 2021-06-11 ENCOUNTER — Telehealth: Payer: Self-pay | Admitting: Family Medicine

## 2021-06-11 NOTE — Telephone Encounter (Signed)
Patient is scheduled to see Dr. Mclea-Scoccuzza tomorrow.  Shailyn Weyandt,cma

## 2021-06-11 NOTE — Telephone Encounter (Signed)
Pt called stating she has been dizzy for three days and is having body aches, headache. Sent to access nurse

## 2021-06-12 ENCOUNTER — Encounter: Payer: Self-pay | Admitting: Internal Medicine

## 2021-06-12 ENCOUNTER — Ambulatory Visit (INDEPENDENT_AMBULATORY_CARE_PROVIDER_SITE_OTHER): Payer: Medicare HMO

## 2021-06-12 ENCOUNTER — Other Ambulatory Visit: Payer: Self-pay

## 2021-06-12 ENCOUNTER — Ambulatory Visit (INDEPENDENT_AMBULATORY_CARE_PROVIDER_SITE_OTHER): Payer: Medicare HMO | Admitting: Internal Medicine

## 2021-06-12 VITALS — Temp 98.1°F | Ht 60.0 in | Wt 141.6 lb

## 2021-06-12 DIAGNOSIS — E611 Iron deficiency: Secondary | ICD-10-CM | POA: Diagnosis not present

## 2021-06-12 DIAGNOSIS — E559 Vitamin D deficiency, unspecified: Secondary | ICD-10-CM

## 2021-06-12 DIAGNOSIS — Z1211 Encounter for screening for malignant neoplasm of colon: Secondary | ICD-10-CM | POA: Diagnosis not present

## 2021-06-12 DIAGNOSIS — M542 Cervicalgia: Secondary | ICD-10-CM

## 2021-06-12 DIAGNOSIS — M79601 Pain in right arm: Secondary | ICD-10-CM

## 2021-06-12 DIAGNOSIS — I10 Essential (primary) hypertension: Secondary | ICD-10-CM | POA: Diagnosis not present

## 2021-06-12 DIAGNOSIS — R7303 Prediabetes: Secondary | ICD-10-CM | POA: Diagnosis not present

## 2021-06-12 DIAGNOSIS — K5900 Constipation, unspecified: Secondary | ICD-10-CM | POA: Diagnosis not present

## 2021-06-12 DIAGNOSIS — K635 Polyp of colon: Secondary | ICD-10-CM

## 2021-06-12 DIAGNOSIS — R42 Dizziness and giddiness: Secondary | ICD-10-CM

## 2021-06-12 DIAGNOSIS — Z1231 Encounter for screening mammogram for malignant neoplasm of breast: Secondary | ICD-10-CM

## 2021-06-12 DIAGNOSIS — Z23 Encounter for immunization: Secondary | ICD-10-CM | POA: Diagnosis not present

## 2021-06-12 DIAGNOSIS — M62838 Other muscle spasm: Secondary | ICD-10-CM

## 2021-06-12 DIAGNOSIS — I779 Disorder of arteries and arterioles, unspecified: Secondary | ICD-10-CM

## 2021-06-12 LAB — CBC WITH DIFFERENTIAL/PLATELET
Basophils Absolute: 0.1 10*3/uL (ref 0.0–0.1)
Basophils Relative: 0.8 % (ref 0.0–3.0)
Eosinophils Absolute: 0.1 10*3/uL (ref 0.0–0.7)
Eosinophils Relative: 1.2 % (ref 0.0–5.0)
HCT: 41.9 % (ref 36.0–46.0)
Hemoglobin: 13.4 g/dL (ref 12.0–15.0)
Lymphocytes Relative: 31.4 % (ref 12.0–46.0)
Lymphs Abs: 2.6 10*3/uL (ref 0.7–4.0)
MCHC: 32.1 g/dL (ref 30.0–36.0)
MCV: 83.5 fl (ref 78.0–100.0)
Monocytes Absolute: 0.5 10*3/uL (ref 0.1–1.0)
Monocytes Relative: 6.7 % (ref 3.0–12.0)
Neutro Abs: 4.9 10*3/uL (ref 1.4–7.7)
Neutrophils Relative %: 59.9 % (ref 43.0–77.0)
Platelets: 282 10*3/uL (ref 150.0–400.0)
RBC: 5.01 Mil/uL (ref 3.87–5.11)
RDW: 14.1 % (ref 11.5–15.5)
WBC: 8.2 10*3/uL (ref 4.0–10.5)

## 2021-06-12 LAB — IBC + FERRITIN
Ferritin: 182.5 ng/mL (ref 10.0–291.0)
Iron: 68 ug/dL (ref 42–145)
Saturation Ratios: 20 % (ref 20.0–50.0)
TIBC: 340.2 ug/dL (ref 250.0–450.0)
Transferrin: 243 mg/dL (ref 212.0–360.0)

## 2021-06-12 LAB — COMPREHENSIVE METABOLIC PANEL
ALT: 14 U/L (ref 0–35)
AST: 17 U/L (ref 0–37)
Albumin: 4.2 g/dL (ref 3.5–5.2)
Alkaline Phosphatase: 79 U/L (ref 39–117)
BUN: 15 mg/dL (ref 6–23)
CO2: 31 mEq/L (ref 19–32)
Calcium: 9.9 mg/dL (ref 8.4–10.5)
Chloride: 104 mEq/L (ref 96–112)
Creatinine, Ser: 0.65 mg/dL (ref 0.40–1.20)
GFR: 87.98 mL/min (ref >60.00)
Glucose, Bld: 86 mg/dL (ref 70–99)
Potassium: 4.2 mEq/L (ref 3.5–5.1)
Sodium: 142 mEq/L (ref 135–145)
Total Bilirubin: 0.4 mg/dL (ref 0.2–1.2)
Total Protein: 7 g/dL (ref 6.0–8.3)

## 2021-06-12 LAB — LIPID PANEL
Cholesterol: 205 mg/dL — ABNORMAL HIGH (ref 0–200)
HDL: 61.5 mg/dL (ref >39.00)
LDL Cholesterol: 121 mg/dL — ABNORMAL HIGH (ref 0–99)
NonHDL: 143.77
Total CHOL/HDL Ratio: 3
Triglycerides: 113 mg/dL (ref 0.0–149.0)
VLDL: 22.6 mg/dL (ref 0.0–40.0)

## 2021-06-12 LAB — HEMOGLOBIN A1C: Hgb A1c MFr Bld: 6.2 % (ref 4.6–6.5)

## 2021-06-12 LAB — VITAMIN D 25 HYDROXY (VIT D DEFICIENCY, FRACTURES): VITD: 56.76 ng/mL (ref 30.00–100.00)

## 2021-06-12 MED ORDER — MECLIZINE HCL 25 MG PO TABS: 12.5000 mg | ORAL_TABLET | Freq: Three times a day (TID) | ORAL | 0 refills | Status: DC | PRN

## 2021-06-12 MED ORDER — OLMESARTAN MEDOXOMIL 20 MG PO TABS: 10.0000 mg | ORAL_TABLET | Freq: Every day | ORAL | 1 refills | Status: DC

## 2021-06-12 NOTE — Patient Instructions (Addendum)
Ice or heat  Aspercream with lidocraine or voltaren gel  Tylenol   ENT [phone] Not available [place] [id number]      Specialties     ENT             Colonoscopy they will call you  MD Physician   Primary Contact Information  Phone Fax E-mail Address  [phone] Not available [place] [id number]     Specialties     Gastroenterology       For constipation Miralax 2 caps and colace  Warm prune juice    Vertigo Vertigo is the feeling that you or your surroundings are moving when they are not. This feeling can come and go at any time. Vertigo often goes away on its own. Vertigo can be dangerous if it occurs while you are doing something that could endanger yourself or others, such as driving or operating machinery. Your health care provider will do tests to try to determine the cause of your vertigo. Tests will also help your health care provider decide how best to treat your condition. Follow these instructions at home: Eating and drinking   Dehydration can make vertigo worse. Drink enough fluid to keep your urine pale yellow. Do not drink alcohol. Activity Return to your normal activities as told by your health care provider. Ask your health care provider what activities are safe for you. In the morning, first sit up on the side of the bed. When you feel okay, stand slowly while you hold onto something until you know that your balance is fine. Move slowly. Avoid sudden body or head movements or certain positions, as told by your health care provider. If you have trouble walking or keeping your balance, try using a cane for stability. If you feel dizzy or unstable, sit down right away. Avoid doing any tasks that would cause danger to you or others if vertigo occurs. Avoid bending down if you feel dizzy. Place items in your home so that they are easy for you to reach without  bending or leaning over. Do not drive or use machinery if you feel dizzy. General instructions Take over-the-counter and prescription medicines only as told by your health care provider. Keep all follow-up visits. This is important. Contact a health care provider if: Your medicines do not relieve your vertigo or they make it worse. Your condition gets worse or you develop new symptoms. You have a fever. You develop nausea or vomiting, or if nausea gets worse. Your family or friends notice any behavioral changes. You have numbness or a prickling and tingling sensation in part of your body. Get help right away if you: Are always dizzy or you faint. Develop severe headaches. Develop a stiff neck. Develop sensitivity to light. Have difficulty moving or speaking. Have weakness in your hands, arms, or legs. Have changes in your hearing or vision. These symptoms may represent a serious problem that is an emergency. Do not wait to see if the symptoms will go away. Get medical help right away. Call your local emergency services (911 in the [place] Do not drive yourself to the hospital. Summary Vertigo is the feeling that you or your surroundings are moving when they are not. Your health care provider will do tests to try to determine the cause of your vertigo. Follow instructions for home care. You may be told to avoid certain tasks, positions, or  movements. Contact a health care provider if your medicines do not relieve your symptoms, or if you have a fever, nausea, vomiting, or changes in behavior. Get help right away if you have severe headaches or difficulty speaking, or you develop hearing or vision problems. This information is not intended to replace advice given to you by your health care provider. Make sure you discuss any questions you have with your health care provider. Document Revised: 03/27/2020 Document Reviewed: 03/27/2020 Elsevier Patient Education  2022 [place].  Recombinant Zoster (Shingles) Vaccine: What You Need to Know 1. Why get vaccinated? Recombinant zoster (shingles) vaccine can prevent shingles. Shingles (also called herpes zoster, or just zoster) is a painful skin rash, usually with blisters. In addition to the rash, shingles can cause fever, headache, chills, or upset stomach. Rarely, shingles can lead to complications such as pneumonia, hearing problems, blindness, brain inflammation (encephalitis), or death. The risk of shingles increases with age. The most common complication of shingles is long-term nerve pain called postherpetic neuralgia (PHN). PHN occurs in the areas where the shingles rash was and can last for months or years after the rash goes away. The pain from PHN can be severe and debilitating. The risk of PHN increases with age. An older adult with shingles is more likely to develop PHN and have longer lasting and more severe pain than a younger person. People with weakened immune systems also have a higher risk of getting shingles and complications from the disease. Shingles is caused by varicella-zoster virus, the same virus that causes chickenpox. After you have chickenpox, the virus stays in your body and can cause shingles later in life. Shingles cannot be passed from one person to another, but the virus that causes shingles can spread and cause chickenpox in someone who has never had chickenpox or has never received chickenpox vaccine. 2. Recombinant shingles vaccine Recombinant shingles vaccine provides strong protection against shingles. By preventing shingles, recombinant shingles vaccine also protects against PHN and other complications. Recombinant shingles vaccine is recommended for: Adults 62 years and older Adults 19 years and older who have a weakened immune system because of disease or treatments Shingles vaccine is given as a two-dose series. For most people, the second dose should be given 2 to 6 months after  the first dose. Some people who have or will have a weakened immune system can get the second dose 1 to 2 months after the first dose. Ask your health care provider for guidance. People who have had shingles in the past and people who have received varicella (chickenpox) vaccine are recommended to get recombinant shingles vaccine. The vaccine is also recommended for people who have already gotten another type of shingles vaccine, the live shingles vaccine. There is no live virus in recombinant shingles vaccine. Shingles vaccine may be given at the same time as other vaccines. 3. Talk with your health care provider Tell your vaccination provider if the person getting the vaccine: Has had an allergic reaction after a previous dose of recombinant shingles vaccine, or has any severe, life-threatening allergies Is currently experiencing an episode of shingles Is pregnant In some cases, your health care provider may decide to postpone shingles vaccination until a future visit. People with minor illnesses, such as a cold, may be vaccinated. People who are moderately or severely ill should usually wait until they recover before getting recombinant shingles vaccine. Your health care provider can give you more information. 4. Risks of a vaccine reaction A sore arm  with mild or moderate pain is very common after recombinant shingles vaccine. Redness and swelling can also happen at the site of the injection. Tiredness, muscle pain, headache, shivering, fever, stomach pain, and nausea are common after recombinant shingles vaccine. These side effects may temporarily prevent a vaccinated person from doing regular activities. Symptoms usually go away on their own in 2 to 3 days. You should still get the second dose of recombinant shingles vaccine even if you had one of these reactions after the first dose. Guillain-Barr syndrome (GBS), a serious nervous system disorder, has been reported very rarely after  recombinant zoster vaccine. People sometimes faint after medical procedures, including vaccination. Tell your provider if you feel dizzy or have vision changes or ringing in the ears. As with any medicine, there is a very remote chance of a vaccine causing a severe allergic reaction, other serious injury, or death. 5. What if there is a serious problem? An allergic reaction could occur after the vaccinated person leaves the clinic. If you see signs of a severe allergic reaction (hives, swelling of the face and throat, difficulty breathing, a fast heartbeat, dizziness, or weakness), call 9-1-1 and get the person to the nearest hospital. For other signs that concern you, call your health care provider. Adverse reactions should be reported to the Vaccine Adverse Event Reporting System (VAERS). Your health care provider will usually file this report, or you can do it yourself. Visit the VAERS website at www.vaers.SamedayNews.es or call 331 824 5956. VAERS is only for reporting reactions, and VAERS staff members do not give medical advice. 6. How can I learn more? Ask your health care provider. Call your local or state health department. Visit the website of the Food and Drug Administration (FDA) for vaccine package inserts and additional information at http://lopez-wang.org/. Contact the Centers for Disease Control and Prevention (CDC): Call 781-087-5158 (1-800-CDC-INFO) or Visit CDC's website at http://hunter.com/. Vaccine Information Statement Recombinant Zoster Vaccine (06/14/2020) This information is not intended to replace advice given to you by your health care provider. Make sure you discuss any questions you have with your health care provider. Document Revised: 01/10/2021 Document Reviewed: 06/28/2020 Elsevier Patient Education  2022 [place].  Neck Exercises Ask your health care provider which exercises are safe for you. Do exercises exactly as told by your health  care provider and adjust them as directed. It is normal to feel mild stretching, pulling, tightness, or discomfort as you do these exercises. Stop right away if you feel sudden pain or your pain gets worse. Do not begin these exercises until told by your health care provider. Neck exercises can be important for many reasons. They can improve strength and maintain flexibility in your neck, which will help your upper back and prevent neck pain. Stretching exercises Rotation neck stretching  Sit in a chair or stand up. Place your feet flat on the floor, shoulder-width apart. Slowly turn your head (rotate) to the right until a slight stretch is felt. Turn it all the way to the right so you can look over your right shoulder. Do not tilt or tip your head. Hold this position for 10-30 seconds. Slowly turn your head (rotate) to the left until a slight stretch is felt. Turn it all the way to the left so you can look over your left shoulder. Do not tilt or tip your head. Hold this position for 10-30 seconds. Repeat __________ times. Complete this exercise __________ times a day. Neck retraction  Sit in a sturdy chair or  stand up. Look straight ahead. Do not bend your neck. Use your fingers to push your chin backward (retraction). Do not bend your neck for this movement. Continue to face straight ahead. If you are doing the exercise properly, you will feel a slight sensation in your throat and a stretch at the back of your neck. Hold the stretch for 1-2 seconds. Repeat __________ times. Complete this exercise __________ times a day. Strengthening exercises Neck press  Lie on your back on a firm bed or on the floor with a pillow under your head. Use your neck muscles to push your head down on the pillow and straighten your spine. Hold the position as well as you can. Keep your head facing up (in a neutral position) and your chin tucked. Slowly count to 5 while holding this position. Repeat __________  times. Complete this exercise __________ times a day. Isometrics These are exercises in which you strengthen the muscles in your neck while keeping your neck still (isometrics). Sit in a supportive chair and place your hand on your forehead. Keep your head and face facing straight ahead. Do not flex or extend your neck while doing isometrics. Push forward with your head and neck while pushing back with your hand. Hold for 10 seconds. Do the sequence again, this time putting your hand against the back of your head. Use your head and neck to push backward against the hand pressure. Finally, do the same exercise on either side of your head, pushing sideways against the pressure of your hand. Repeat __________ times. Complete this exercise __________ times a day. Prone head lifts  Lie face-down (prone position), resting on your elbows so that your chest and upper back are raised. Start with your head facing downward, near your chest. Position your chin either on or near your chest. Slowly lift your head upward. Lift until you are looking straight ahead. Then continue lifting your head as far back as you can comfortably stretch. Hold your head up for 5 seconds. Then slowly lower it to your starting position. Repeat __________ times. Complete this exercise __________ times a day. Supine head lifts  Lie on your back (supine position), bending your knees to point to the ceiling and keeping your feet flat on the floor. Lift your head slowly off the floor, raising your chin toward your chest. Hold for 5 seconds. Repeat __________ times. Complete this exercise __________ times a day. Scapular retraction  Stand with your arms at your sides. Look straight ahead. Slowly pull both shoulders (scapulae) backward and downward (retraction) until you feel a stretch between your shoulder blades in your upper back. Hold for 10-30 seconds. Relax and repeat. Repeat __________ times. Complete this exercise  __________ times a day. Contact a health care provider if: Your neck pain or discomfort gets worse when you do an exercise. Your neck pain or discomfort does not improve within 2 hours after you exercise. If you have any of these problems, stop exercising right away. Do not do the exercises again unless your health care provider says that you can. Get help right away if: You develop sudden, severe neck pain. If this happens, stop exercising right away. Do not do the exercises again unless your health care provider says that you can. This information is not intended to replace advice given to you by your health care provider. Make sure you discuss any questions you have with your health care provider. Document Revised: 10/22/2020 Document Reviewed: 10/22/2020 Elsevier Patient Education  2022  [employer] ° °

## 2021-06-12 NOTE — Progress Notes (Signed)
Chief Complaint  Patient presents with   Dizziness   F/u  1. Dizziness/lightheadedness onset Saturday in 2018 had similar sxs. Current the room is spinning and feels nausea in the past ear exercises helped but not currently she ? At times when dizzy if fatigue and shaking and 15 years ago reports doctors told her she had epilepsy Head positions make her dizzy  2. C/o constipation taking miralax not helping   3. Neck pain worse with Rom and lifting and cleaning for husband who has colorectal cancer  Review of Systems  Constitutional:  Positive for malaise/fatigue. Negative for weight loss.  HENT:  Negative for hearing loss.   Eyes:  Negative for blurred vision.  Respiratory:  Negative for shortness of breath.   Cardiovascular:  Negative for chest pain.  Gastrointestinal:  Negative for abdominal pain and blood in stool.  Genitourinary:  Negative for dysuria.  Musculoskeletal:  Negative for falls and joint pain.  Skin:  Negative for rash.  Neurological:  Positive for dizziness. Negative for headaches.  Psychiatric/Behavioral:  Negative for depression.   Past Medical History:  Diagnosis Date   Allergy    Seasonal   Chicken pox    Depression    GERD (gastroesophageal reflux disease)    Glaucoma    s/p surgery   History of chicken pox    History of pneumonia 2013   [facility]   Hypertension    Prediabetes 2011   h/o A1c 6.5%   Seasonal allergies    Smoking    Past Surgical History:  Procedure Laterality Date   CATARACT EXTRACTION W/PHACO Right 08/27/2015   Procedure: CATARACT EXTRACTION PHACO AND INTRAOCULAR LENS PLACEMENT ([place]);  Surgeon: Birder Robson, MD;  Location: [facility] ORS;  Service: Ophthalmology;  Laterality: Right;  [place] 00:53 AP% 20.1 CDE 10.80 fluid pack lot # [id number]   CESAREAN SECTION  1977   COLONOSCOPY     Family History  Problem Relation Age of Onset   Heart disease Mother    Stroke Mother    Cancer Sister 47       colon and otherwise   Stroke Maternal  Aunt    Coronary artery disease Cousin    Diabetes Neg Hx    Breast cancer Neg Hx    Social History   Socioeconomic History   Marital status: Married    Spouse name: Not on file   Number of children: Not on file   Years of education: Not on file   Highest education level: Not on file  Occupational History   Not on file  Tobacco Use   Smoking status: Every Day    Packs/day: 0.25    Types: Cigarettes   Smokeless tobacco: Never  Substance and Sexual Activity   Alcohol use: Yes    Alcohol/week: 0.0 standard drinks    Comment: Regular on weekends   Drug use: No   Sexual activity: Not on file  Other Topics Concern   Not on file  Social History Narrative   Caffeine: 2 cups coffee/day   Lives with husband and son and step son   Occupation: homemaker   Edu:   Activity: wants to restart gym.  No regular activity   Diet: good water, vegetables daily   From [place]    Social Determinants of Health   Financial Resource Strain: Low Risk    Difficulty of Paying Living Expenses: Not hard at all  Food Insecurity: No Food Insecurity   Worried About Charity fundraiser in the  Last Year: Never true   Ran Out of Food in the Last Year: Never true  Transportation Needs: No Transportation Needs   Lack of Transportation (Medical): No   Lack of Transportation (Non-Medical): No  Physical Activity: Insufficiently Active   Days of Exercise per Week: 7 days   Minutes of Exercise per Session: 10 min  Stress: No Stress Concern Present   Feeling of Stress : Only a little  Social Connections: Unknown   Frequency of Communication with Friends and Family: More than three times a week   Frequency of Social Gatherings with Friends and Family: Three times a week   Attends Religious Services: Not on file   Active Member of Clubs or Organizations: Not on file   Attends Archivist Meetings: Not on file   Marital Status: Married  Intimate Partner Violence: Not At Risk   Fear of Current  or Ex-Partner: No   Emotionally Abused: No   Physically Abused: No   Sexually Abused: No   Current Meds  Medication Sig   Blood Glucose Monitoring Suppl (ACCU-CHEK AVIVA PLUS) w/Device KIT 1 Device by Does not apply route as needed.   esomeprazole (NEXIUM) 20 MG capsule Take 20 mg by mouth daily at 12 noon.   fluticasone (FLONASE) 50 MCG/ACT nasal spray Place 2 sprays into both nostrils daily.   glucose blood (ACCU-CHEK AVIVA) test strip Used to check blood sugars once a day.   Lancets (ACCU-CHEK SOFT TOUCH) lancets Use to check blood sugars once a day.   meclizine (ANTIVERT) 25 MG tablet Take 0.5-1 tablets (12.5-25 mg total) by mouth 3 (three) times daily as needed for dizziness.   rosuvastatin (CRESTOR) 20 MG tablet Take 1 tablet (20 mg total) by mouth daily.   [DISCONTINUED] olmesartan (BENICAR) 20 MG tablet Take 1 tablet (20 mg total) by mouth daily.   Allergies  Allergen Reactions   Lipitor [Atorvastatin Calcium]     Nausea    No results found for this or any previous visit (from the past 2160 hour(s)). Objective  Body mass index is 27.65 kg/m. Wt Readings from Last 3 Encounters:  06/12/21 141 lb 9.6 oz (64.2 kg)  09/13/20 143 lb 12.8 oz (65.2 kg)  08/08/20 155 lb (70.3 kg)   Temp Readings from Last 3 Encounters:  06/12/21 98.1 F (36.7 C) (Oral)  11/18/20 98.5 F (36.9 C) (Oral)  09/13/20 97.9 F (36.6 C) (Oral)   BP Readings from Last 3 Encounters:  11/18/20 124/73  09/13/20 120/70  08/08/19 128/79   Pulse Readings from Last 3 Encounters:  11/18/20 70  09/13/20 68  10/06/18 90    Physical Exam Vitals and nursing note reviewed.  Constitutional:      Appearance: Normal appearance. She is well-developed and well-groomed.  HENT:     Head: Normocephalic and atraumatic.  Eyes:     Conjunctiva/sclera: Conjunctivae normal.     Pupils: Pupils are equal, round, and reactive to light.  Cardiovascular:     Rate and Rhythm: Normal rate and regular rhythm.      Heart sounds: Normal heart sounds. No murmur heard. Pulmonary:     Effort: Pulmonary effort is normal.     Breath sounds: Normal breath sounds.  Abdominal:     General: Abdomen is flat. Bowel sounds are normal.     Tenderness: There is no abdominal tenderness.  Musculoskeletal:        General: No tenderness.  Skin:    General: Skin is warm and dry.  Neurological:     General: No focal deficit present.     Mental Status: She is alert and oriented to person, place, and time. Mental status is at baseline.     Cranial Nerves: Cranial nerves 2-12 are intact.     Sensory: Sensation is intact.     Motor: Motor function is intact.     Coordination: Coordination is intact.     Gait: Gait is intact.  Psychiatric:        Attention and Perception: Attention and perception normal.        Mood and Affect: Mood and affect normal.        Speech: Speech normal.        Behavior: Behavior normal. Behavior is cooperative.        Thought Content: Thought content normal.        Cognition and Memory: Cognition and memory normal.        Judgment: Judgment normal.    Assessment  Plan  Vertigo - Plan: meclizine (ANTIVERT) 25 MG tablet, Ambulatory referral to ENT, MR Brain W Wo Contrast Consider neurology if not better in the future  Orthostatics lying 120/64 hr 68 dizzy Standing 122/60 hr 71 dizzy  Standing 120/76 hr 76 w/o sx's   Cervicalgia - Plan: DG Cervical Spine Complete  Prediabetes - Plan: Hemoglobin A1c  Essential hypertension - Plan: olmesartan (BENICAR) 20 MG tablet BP running low normal rec cut benicar 1/2 dose 10 mg qd at times 1 teens sbp and does not take the medication and did not take it today  Comprehensive metabolic panel, Lipid panel, CBC with Differential/Platelet  Iron deficiency - Plan: IBC + Ferritin,   Vitamin D deficiency - Plan: Vitamin D (25 hydroxy)  Constipation, unspecified constipation type - Plan: Ambulatory referral to Gastroenterology Screening for colon  cancer - Plan: Ambulatory referral to Gastroenterology Polyp of colon, unspecified part of colon, unspecified type - Plan: Ambulatory referral to Gastroenterology  Screening mammogram, encounter for - Plan: MM 3D SCREEN BREAST BILATERAL  Pt to call and schedule   HM Flu shot today  Given shingrix vaccine  Ordered mammo and referred colonoscopy    Provider: Dr. Olivia Mackie McLean-Scocuzza-Internal Medicine

## 2021-06-17 ENCOUNTER — Telehealth: Payer: Self-pay | Admitting: Family Medicine

## 2021-06-17 MED ORDER — CYCLOBENZAPRINE HCL 5 MG PO TABS: 5.0000 mg | ORAL_TABLET | Freq: Every day | ORAL | 2 refills | Status: DC | PRN

## 2021-06-17 NOTE — Telephone Encounter (Signed)
Pt called wanting blood results from an appointment she had with [facility]

## 2021-06-17 NOTE — Addendum Note (Signed)
Addended by: Orland Mustard on: 06/17/2021 04:31 PM   Modules accepted: Orders

## 2021-06-18 NOTE — Telephone Encounter (Signed)
I called and informed the patient of lab results and she understood.  Patient also asked if I would mail her results and I put them in the mail today. Halo Laski,cma

## 2021-06-25 ENCOUNTER — Other Ambulatory Visit: Payer: Self-pay

## 2021-06-25 ENCOUNTER — Ambulatory Visit
Admission: RE | Admit: 2021-06-25 | Discharge: 2021-06-25 | Disposition: A | Discharge status: Home / Self Care | Payer: Medicare HMO | Source: Ambulatory Visit | Attending: Internal Medicine | Admitting: Internal Medicine

## 2021-06-25 DIAGNOSIS — H538 Other visual disturbances: Secondary | ICD-10-CM | POA: Diagnosis not present

## 2021-06-25 DIAGNOSIS — R519 Headache, unspecified: Secondary | ICD-10-CM | POA: Diagnosis not present

## 2021-06-25 DIAGNOSIS — M542 Cervicalgia: Secondary | ICD-10-CM | POA: Diagnosis not present

## 2021-06-25 DIAGNOSIS — R42 Dizziness and giddiness: Secondary | ICD-10-CM | POA: Diagnosis not present

## 2021-06-25 MED ORDER — GADOBUTROL 1 MMOL/ML IV SOLN
6.0000 mL | Freq: Once | INTRAVENOUS | Status: AC | PRN
  Administered 2021-06-25: 6 mL via INTRAVENOUS

## 2021-07-18 ENCOUNTER — Ambulatory Visit
Admission: RE | Admit: 2021-07-18 | Discharge: 2021-07-18 | Disposition: A | Discharge status: Home / Self Care | Payer: Medicare HMO | Source: Ambulatory Visit | Attending: Internal Medicine | Admitting: Internal Medicine

## 2021-07-18 ENCOUNTER — Other Ambulatory Visit: Payer: Self-pay

## 2021-07-18 DIAGNOSIS — Z1231 Encounter for screening mammogram for malignant neoplasm of breast: Secondary | ICD-10-CM | POA: Insufficient documentation

## 2021-08-11 ENCOUNTER — Ambulatory Visit (INDEPENDENT_AMBULATORY_CARE_PROVIDER_SITE_OTHER): Payer: Medicare HMO

## 2021-08-11 VITALS — Ht 60.0 in | Wt 141.0 lb

## 2021-08-11 DIAGNOSIS — Z0000 Encounter for general adult medical examination without abnormal findings: Secondary | ICD-10-CM | POA: Diagnosis not present

## 2021-08-11 NOTE — Progress Notes (Signed)
Subjective:   Rayniya Bringas is a 71 y.o. female who presents for Medicare Annual (Subsequent) preventive examination.  Review of Systems    No ROS.  Medicare Wellness Virtual Visit.  Visual/audio telehealth visit, UTA vital signs.   See social history for additional risk factors.   Cardiac Risk Factors include: advanced age (>63men, >23 women);hypertension     Objective:    Today's Vitals   08/11/21 1208  Weight: 141 lb (64 kg)  Height: 5' (1.524 m)   Body mass index is 27.54 kg/m.     08/11/2021   12:29 PM 08/08/2020   10:41 AM 08/08/2019   10:45 AM 03/15/2018    6:17 PM 12/08/2017    9:24 AM 05/29/2016   11:54 AM 12/12/2015   12:01 PM  Advanced Directives  Does Patient Have a Medical Advance Directive? No No No No No No No  Would patient like information on creating a medical advance directive? No - Patient declined No - Patient declined No - Patient declined No - Patient declined No - Patient declined  Yes - Educational materials given    Current Medications (verified) Outpatient Encounter Medications as of 08/11/2021  Medication Sig   azelastine (ASTELIN) 0.1 % nasal spray Place 1 spray into both nostrils 2 (two) times daily for 7 days. Use in each nostril as directed   Blood Glucose Monitoring Suppl (ACCU-CHEK AVIVA PLUS) w/Device KIT 1 Device by Does not apply route as needed.   cyclobenzaprine (FLEXERIL) 5 MG tablet Take 1 tablet (5 mg total) by mouth daily as needed for muscle spasms.   esomeprazole (NEXIUM) 20 MG capsule Take 20 mg by mouth daily at 12 noon.   fexofenadine (ALLEGRA ALLERGY) 180 MG tablet Take 2 tablets (360 mg total) by mouth at bedtime. (Patient not taking: Reported on 06/12/2021)   fluticasone (FLONASE) 50 MCG/ACT nasal spray Place 2 sprays into both nostrils daily.   glucose blood (ACCU-CHEK AVIVA) test strip Used to check blood sugars once a day.   Lancets (ACCU-CHEK SOFT TOUCH) lancets Use to check blood sugars once a day.   meclizine (ANTIVERT)  25 MG tablet Take 0.5-1 tablets (12.5-25 mg total) by mouth 3 (three) times daily as needed for dizziness.   olmesartan (BENICAR) 20 MG tablet Take 0.5 tablets (10 mg total) by mouth daily. D/c 20 mg dose   rosuvastatin (CRESTOR) 20 MG tablet Take 1 tablet (20 mg total) by mouth daily.   No facility-administered encounter medications on file as of 08/11/2021.   Allergies (verified) Lipitor [atorvastatin calcium]   History: Past Medical History:  Diagnosis Date   Allergy    Seasonal   Chicken pox    Depression    GERD (gastroesophageal reflux disease)    Glaucoma    s/p surgery   History of chicken pox    History of pneumonia 2013   [facility]   Hypertension    Prediabetes 2011   h/o A1c 6.5%   Seasonal allergies    Smoking    Past Surgical History:  Procedure Laterality Date   CATARACT EXTRACTION W/PHACO Right 08/27/2015   Procedure: CATARACT EXTRACTION PHACO AND INTRAOCULAR LENS PLACEMENT ([facility]);  Surgeon: Galen Manila, MD;  Location: [facility] ORS;  Service: Ophthalmology;  Laterality: Right;  [place] 00:53 AP% 20.1 CDE 10.80 fluid pack lot # [id number]   CESAREAN SECTION  1977   COLONOSCOPY     Family History  Problem Relation Age of Onset   Heart disease Mother    Stroke Mother  Cancer Sister 69       colon and otherwise   Stroke Maternal Aunt    Coronary artery disease Cousin    Diabetes Neg Hx    Breast cancer Neg Hx    Social History   Socioeconomic History   Marital status: Married    Spouse name: Not on file   Number of children: Not on file   Years of education: Not on file   Highest education level: Not on file  Occupational History   Not on file  Tobacco Use   Smoking status: Every Day    Packs/day: 0.25    Types: Cigarettes   Smokeless tobacco: Never  Substance and Sexual Activity   Alcohol use: Yes    Alcohol/week: 0.0 standard drinks    Comment: Regular on weekends   Drug use: No   Sexual activity: Not on file  Other Topics Concern   Not on file   Social History Narrative   Caffeine: 2 cups coffee/day   Lives with husband and son and step son   Occupation: homemaker   Edu:   Activity: wants to restart gym.  No regular activity   Diet: good water, vegetables daily   From [place]    Social Determinants of Health   Financial Resource Strain: Low Risk    Difficulty of Paying Living Expenses: Not hard at all  Food Insecurity: No Food Insecurity   Worried About Programme researcher, broadcasting/film/video in the Last Year: Never true   Barista in the Last Year: Never true  Transportation Needs: No Transportation Needs   Lack of Transportation (Medical): No   Lack of Transportation (Non-Medical): No  Physical Activity: Insufficiently Active   Days of Exercise per Week: 7 days   Minutes of Exercise per Session: 10 min  Stress: No Stress Concern Present   Feeling of Stress : Not at all  Social Connections: Unknown   Frequency of Communication with Friends and Family: More than three times a week   Frequency of Social Gatherings with Friends and Family: Three times a week   Attends Religious Services: Not on file   Active Member of Clubs or Organizations: Not on file   Attends Banker Meetings: Not on file   Marital Status: Married   Tobacco Counseling Ready to quit: Not Answered Counseling given: Not Answered  Clinical Intake:  Pre-visit preparation completed: Yes        Diabetes: No  How often do you need to have someone help you when you read instructions, pamphlets, or other written materials from your doctor or pharmacy?: 1 - Never  Interpreter Needed?: No    Activities of Daily Living    08/11/2021   12:31 PM  In your present state of health, do you have any difficulty performing the following activities:  Hearing? 0  Vision? 0  Difficulty concentrating or making decisions? 0  Walking or climbing stairs? 0  Dressing or bathing? 0  Doing errands, shopping? 0  Preparing Food and eating ? N  Using the  Toilet? N  In the past six months, have you accidently leaked urine? N  Do you have problems with loss of bowel control? N  Managing your Medications? N  Managing your Finances? N  Housekeeping or managing your Housekeeping? N   Patient Care Team: Glori Luis, MD as PCP - General (Family Medicine)  Indicate any recent Medical Services you may have received from other than [facility] providers in the  past year (date may be approximate).     Assessment:   This is a routine wellness examination for Gracemarie.  Virtual Visit via Telephone Note  I connected with  Malvenia Hensel on 08/11/21 at 10:30 AM EDT by telephone and verified that I am speaking with the correct person using two identifiers.  Persons participating in the virtual visit: patient/Nurse Health Advisor   I discussed the limitations of performing an evaluation and management service by telehealth. The patient expressed understanding and agreed to proceed. We continued and completed visit with audio only. Some vital signs may be absent or patient reported.   Hearing/Vision screen Hearing Screening - Comments:: Patient is able to hear conversational tones without difficulty. No issues reported. Vision Screening - Comments:: Wears corrective lenses They have seen their ophthalmologist in the last 12 months.   Dietary issues and exercise activities discussed: Healthy diet Current Exercise Habits: Home exercise routine, Type of exercise: walking, Intensity: Mild Regular diet Good water intake   Goals Addressed             This Visit's Progress    Follow up with Primary Care Provider       As needed.        Depression Screen    08/11/2021   12:17 PM 08/08/2020   10:44 AM 08/08/2019   10:44 AM 10/06/2018   11:00 AM 08/18/2017    9:26 AM 09/29/2016    9:53 AM 05/22/2015    9:12 AM  PHQ 2/9 Scores  PHQ - 2 Score 0 1 0 0 0 0 0  PHQ- 9 Score    0       Fall Risk    08/11/2021   12:31 PM 08/08/2020   10:42 AM  08/08/2019   10:44 AM 08/18/2017    9:26 AM 09/29/2016    9:53 AM  Fall Risk   Falls in the past year? 0 0 0 No No  Number falls in past yr: 0 0     Injury with Fall?  0     Follow up Falls evaluation completed Falls evaluation completed Falls evaluation completed;Falls prevention discussed      FALL RISK PREVENTION PERTAINING TO THE HOME: Home free of loose throw rugs in walkways, pet beds, electrical cords, etc? Yes  Adequate lighting in your home to reduce risk of falls? Yes   ASSISTIVE DEVICES UTILIZED TO PREVENT FALLS: Life alert? No  Use of a cane, walker or w/c? No   TIMED UP AND GO: Was the test performed? No .   Cognitive Function:  Patient is alert and oriented x3.       08/11/2021   12:31 PM 08/08/2020   10:46 AM 08/08/2019   10:58 AM  6CIT Screen  What Year? 0 points 0 points 0 points  What month? 0 points 0 points 0 points  What time? 0 points 0 points 0 points  Count back from 20 0 points 0 points 0 points  Months in reverse 0 points 0 points 0 points  Repeat phrase 0 points 0 points 0 points  Total Score 0 points 0 points 0 points    Immunizations Immunization History  Administered Date(s) Administered   DTaP 06/13/2007   Fluad Quad(high Dose 65+) 06/12/2021   Influenza, High Dose Seasonal PF 01/28/2018   Influenza-Unspecified 03/18/2015, 03/11/2016, 05/11/2020   PFIZER(Purple Top)SARS-COV-2 Vaccination 08/10/2019, 09/05/2019   Pneumococcal Polysaccharide-23 09/16/2011   TDAP status: Due, Education has been provided regarding the importance of this vaccine.  Advised may receive this vaccine at local pharmacy or Health Dept. Aware to provide a copy of the vaccination record if obtained from local pharmacy or Health Dept. Verbalized acceptance and understanding. Deferred.   Pneumococcal vaccine status: Due, Education has been provided regarding the importance of this vaccine. Advised may receive this vaccine at local pharmacy or Health Dept. Aware to provide a  copy of the vaccination record if obtained from local pharmacy or Health Dept. Verbalized acceptance and understanding.Deferred.   Shingrix Completed?: No.    Education has been provided regarding the importance of this vaccine. Patient has been advised to call insurance company to determine out of pocket expense if they have not yet received this vaccine. Advised may also receive vaccine at local pharmacy or Health Dept. Verbalized acceptance and understanding.  Screening Tests Health Maintenance  Topic Date Due   Pneumonia Vaccine 95+ Years old (2 - PCV) 09/15/2012   OPHTHALMOLOGY EXAM  08/11/2021 (Originally 07/31/2020)   Zoster Vaccines- Shingrix (1 of 2) 11/10/2021 (Originally 01/23/1999)   COVID-19 Vaccine (3 - Booster for Pfizer series) 06/11/2022 (Originally 10/31/2019)   TETANUS/TDAP  08/12/2022 (Originally 06/12/2017)   FOOT EXAM  09/13/2021   INFLUENZA VACCINE  12/09/2021   HEMOGLOBIN A1C  12/10/2021   MAMMOGRAM  07/19/2023   COLONOSCOPY (Pts 45-28yrs Insurance coverage will need to be confirmed)  07/24/2025   DEXA SCAN  Completed   Hepatitis C Screening  Completed   HPV VACCINES  Aged Out   Health Maintenance Health Maintenance Due  Topic Date Due   Pneumonia Vaccine 94+ Years old (2 - PCV) 09/15/2012   Lung Cancer Screening: (Low Dose CT Chest recommended if Age 63-80 years, 30 pack-year currently smoking OR have quit w/in 15years.) does not qualify.   Vision Screening: Recommended annual ophthalmology exams for early detection of glaucoma and other disorders of the eye.  Dental Screening: Recommended annual dental exams for proper oral hygiene  Community Resource Referral / Chronic Care Management: CRR required this visit?  No   CCM required this visit?  No      Plan:   Keep all routine maintenance appointments.   I have personally reviewed and noted the following in the patient's chart:   Medical and social history Use of alcohol, tobacco or illicit drugs   Current medications and supplements including opioid prescriptions.  Functional ability and status Nutritional status Physical activity Advanced directives List of other physicians Hospitalizations, surgeries, and ER visits in previous 12 months Vitals Screenings to include cognitive, depression, and falls Referrals and appointments  In addition, I have reviewed and discussed with patient certain preventive protocols, quality metrics, and best practice recommendations. A written personalized care plan for preventive services as well as general preventive health recommendations were provided to patient.     Ashok Pall, LPN   05/16/1094

## 2021-08-11 NOTE — Patient Instructions (Addendum)
?  Robin Becker , ?Thank you for taking time to come for your Medicare Wellness Visit. I appreciate your ongoing commitment to your health goals. Please review the following plan we discussed and let me know if I can assist you in the future.  ? ?These are the goals we discussed: ? Goals   ? ?  Follow up with Primary Care Provider   ?  As needed. ? ?  ? ?  ?  ?This is a list of the screening recommended for you and due dates:  ?Health Maintenance  ?Topic Date Due  ? Pneumonia Vaccine (2 - PCV) 09/15/2012  ? Eye exam for diabetics  08/11/2021*  ? Zoster (Shingles) Vaccine (1 of 2) 11/10/2021*  ? COVID-19 Vaccine (3 - Booster for Pfizer series) 06/11/2022*  ? Tetanus Vaccine  08/12/2022*  ? Complete foot exam   09/13/2021  ? Flu Shot  12/09/2021  ? Hemoglobin A1C  12/10/2021  ? Mammogram  07/19/2023  ? Colon Cancer Screening  07/24/2025  ? DEXA scan (bone density measurement)  Completed  ? Hepatitis C Screening: USPSTF Recommendation to screen - Ages 61-79 yo.  Completed  ? HPV Vaccine  Aged Out  ?*Topic was postponed. The date shown is not the original due date.  ?  ?

## 2021-08-26 ENCOUNTER — Encounter: Payer: Self-pay | Admitting: Family Medicine

## 2021-08-26 ENCOUNTER — Ambulatory Visit (INDEPENDENT_AMBULATORY_CARE_PROVIDER_SITE_OTHER): Payer: Medicare HMO | Admitting: Family Medicine

## 2021-08-26 DIAGNOSIS — K219 Gastro-esophageal reflux disease without esophagitis: Secondary | ICD-10-CM | POA: Diagnosis not present

## 2021-08-26 DIAGNOSIS — E785 Hyperlipidemia, unspecified: Secondary | ICD-10-CM

## 2021-08-26 DIAGNOSIS — I10 Essential (primary) hypertension: Secondary | ICD-10-CM

## 2021-08-26 DIAGNOSIS — F17210 Nicotine dependence, cigarettes, uncomplicated: Secondary | ICD-10-CM

## 2021-08-26 DIAGNOSIS — K5900 Constipation, unspecified: Secondary | ICD-10-CM | POA: Diagnosis not present

## 2021-08-26 NOTE — Patient Instructions (Signed)
Nice to see you. ?Please try the MiraLAX daily to help with your bowel movements.  If this is not beneficial please let me know. ?When you see GI in the office prior to your colonoscopy please mention your history of reflux to see if they want to do an upper endoscopy. ?Please reduce your red meat intake. ?

## 2021-08-26 NOTE — Assessment & Plan Note (Signed)
Occasional symptoms based on her diet.  She can continue Nexium 20 mg daily as needed.  She has an appointment with GI in the near future to discuss her colonoscopy.  I have asked her to mention her reflux to them to see if they want to do an endoscopy as well. ?

## 2021-08-26 NOTE — Assessment & Plan Note (Signed)
Well-controlled.  She will continue olmesartan 10 mg daily. ?

## 2021-08-26 NOTE — Assessment & Plan Note (Signed)
No longer on medication.  She will continue dietary changes.  We can plan on checking an LDL in 6 months. ?

## 2021-08-26 NOTE — Assessment & Plan Note (Signed)
Smoking cessation counseling was provided.  Approximately 5 minutes were spent discussing the rationale for tobacco cessation and strategies for doing so.  Adjuncts, including nicotine patches, nicotine lozenges, varenicline and buproprion were recommended.  Patient was unsure about prescription medications for this.  She will consider nicotine patches.  I also discussed lung cancer screening given her duration of smoking.  Referral placed for that.  Follow-up in 6 months on this. ?

## 2021-08-26 NOTE — Progress Notes (Signed)
?Tommi Rumps, MD ?Phone: [phone] ? ?Robin Becker is a 71 y.o. female who presents today for f/u. ? ?HYPERTENSION ?Disease Monitoring ?Home BP Monitoring 110/70 Chest pain- no    Dyspnea- no ?Medications ?Compliance-  taking olmesartan.  Edema- no ?BMET ?   ?Component Value Date/Time  ? NA 142 06/12/2021 1146  ? NA 141 08/30/2014 0000  ? NA 144 08/13/2011 1014  ? K 4.2 06/12/2021 1146  ? K 3.8 08/13/2011 1014  ? K 4.1 01/31/2010 0000  ? CL 104 06/12/2021 1146  ? CL 109 (H) 08/13/2011 1014  ? CO2 31 06/12/2021 1146  ? CO2 25 08/13/2011 1014  ? GLUCOSE 86 06/12/2021 1146  ? GLUCOSE 83 08/13/2011 1014  ? BUN 15 06/12/2021 1146  ? BUN 12 08/30/2014 0000  ? BUN 14 08/13/2011 1014  ? CREATININE 0.65 06/12/2021 1146  ? CREATININE 0.53 (L) 08/13/2011 1014  ? CREATININE 0.61 01/31/2010 0000  ? CALCIUM 9.9 06/12/2021 1146  ? CALCIUM 8.9 08/13/2011 1014  ? CALCIUM 9.2 01/31/2010 0000  ? GFRNONAA >60 03/15/2018 1818  ? GFRNONAA >60 08/13/2011 1014  ? GFRAA >60 03/15/2018 1818  ? GFRAA >60 08/13/2011 1014  ? ?HLD: not taking crestor. She is managing with diet. Eating lots of salads and chicken, though notes she eats lots of red meat.  ? ?GERD:   ?Reflux symptoms: occasionally based on diet   ?Abd pain: no   ?Blood in stool: no  ?Dysphagia: no   ?EGD: no  ?Medication: taking nexium prn ? ?Constipation: Patient notes issues with straining most days.  She takes MiraLAX intermittently to help with this.  Recently she has tried magnesium and a stool softener with little benefit.  She has not been taking the MiraLAX on a daily basis.  No blood in her stool.  No abdominal pain. ? ?Tobacco abuse: Patient notes she started smoking around age 34.  She smoked between half a pack and a pack per day until about a year ago when she quit.  She is back to smoking 3 to 4 cigarettes daily. ? ? ?Social History  ? ?Tobacco Use  ?Smoking Status Every Day  ? Packs/day: 0.25  ? Types: Cigarettes  ?Smokeless Tobacco Never  ? ? ?Current  Outpatient Medications on File Prior to Visit  ?Medication Sig Dispense Refill  ? Blood Glucose Monitoring Suppl (ACCU-CHEK AVIVA PLUS) w/Device KIT 1 Device by Does not apply route as needed. 1 kit 0  ? cyclobenzaprine (FLEXERIL) 5 MG tablet Take 1 tablet (5 mg total) by mouth daily as needed for muscle spasms. 30 tablet 2  ? esomeprazole (NEXIUM) 20 MG capsule Take 20 mg by mouth daily at 12 noon.    ? fexofenadine (ALLEGRA ALLERGY) 180 MG tablet Take 2 tablets (360 mg total) by mouth at bedtime. 180 tablet 1  ? fluticasone (FLONASE) 50 MCG/ACT nasal spray Place 2 sprays into both nostrils daily. 16 g 11  ? glucose blood (ACCU-CHEK AVIVA) test strip Used to check blood sugars once a day. 50 each 12  ? Lancets (ACCU-CHEK SOFT TOUCH) lancets Use to check blood sugars once a day. 100 each 12  ? meclizine (ANTIVERT) 25 MG tablet Take 0.5-1 tablets (12.5-25 mg total) by mouth 3 (three) times daily as needed for dizziness. 90 tablet 0  ? olmesartan (BENICAR) 20 MG tablet Take 0.5 tablets (10 mg total) by mouth daily. D/c 20 mg dose 90 tablet 1  ? rosuvastatin (CRESTOR) 20 MG tablet Take 1 tablet (20 mg total)  by mouth daily. 90 tablet 3  ? azelastine (ASTELIN) 0.1 % nasal spray Place 1 spray into both nostrils 2 (two) times daily for 7 days. Use in each nostril as directed 30 mL 0  ? ?No current facility-administered medications on file prior to visit.  ? ? ? ?ROS see history of present illness ? ?Objective ? ?Physical Exam ?Vitals:  ? 08/26/21 1026  ?BP: 110/70  ?Pulse: 83  ?Temp: 98.3 ?F (36.8 ?C)  ?SpO2: 98%  ? ? ?BP Readings from Last 3 Encounters:  ?08/26/21 110/70  ?11/18/20 124/73  ?09/13/20 120/70  ? ?Wt Readings from Last 3 Encounters:  ?08/26/21 146 lb (66.2 kg)  ?08/11/21 141 lb (64 kg)  ?06/12/21 141 lb 9.6 oz (64.2 kg)  ? ? ?Physical Exam ?Constitutional:   ?   General: She is not in acute distress. ?   Appearance: She is not diaphoretic.  ?Cardiovascular:  ?   Rate and Rhythm: Normal rate and regular  rhythm.  ?   Heart sounds: Normal heart sounds.  ?Pulmonary:  ?   Effort: Pulmonary effort is normal.  ?   Breath sounds: Normal breath sounds.  ?Abdominal:  ?   General: Bowel sounds are normal. There is no distension.  ?   Palpations: Abdomen is soft.  ?   Tenderness: There is no abdominal tenderness.  ?Skin: ?   General: Skin is warm and dry.  ?Neurological:  ?   Mental Status: She is alert.  ? ? ? ?Assessment/Plan: Please see individual problem list. ? ?Problem List Items Addressed This Visit   ? ? Constipation (Chronic)  ?  Chronic issue.  I encouraged her to take her MiraLAX daily.  If this is not beneficial she will let us know. ? ?  ?  ? Essential hypertension (Chronic)  ?  Well-controlled.  She will continue olmesartan 10 mg daily. ? ?  ?  ? GERD (gastroesophageal reflux disease) (Chronic)  ?  Occasional symptoms based on her diet.  She can continue Nexium 20 mg daily as needed.  She has an appointment with GI in the near future to discuss her colonoscopy.  I have asked her to mention her reflux to them to see if they want to do an endoscopy as well. ? ?  ?  ? HLD (hyperlipidemia) (Chronic)  ?  No longer on medication.  She will continue dietary changes.  We can plan on checking an LDL in 6 months. ? ?  ?  ? Nicotine dependence, cigarettes, uncomplicated (Chronic)  ?  Smoking cessation counseling was provided.  Approximately 5 minutes were spent discussing the rationale for tobacco cessation and strategies for doing so.  Adjuncts, including nicotine patches, nicotine lozenges, varenicline and buproprion were recommended.  Patient was unsure about prescription medications for this.  She will consider nicotine patches.  I also discussed lung cancer screening given her duration of smoking.  Referral placed for that.  Follow-up in 6 months on this. ? ?  ?  ? Relevant Orders  ? Ambulatory Referral for Lung Cancer Scre  ? ? ?Return in about 6 months (around 02/25/2022) for Hypertension/hyperlipidemia. ? ?This  visit occurred during the SARS-CoV-2 public health emergency.  Safety protocols were in place, including screening questions prior to the visit, additional usage of staff PPE, and extensive cleaning of exam room while observing appropriate contact time as indicated for disinfecting solutions.  ? ? ?Tommi Rumps, MD ?[place] ? ?

## 2021-08-26 NOTE — Assessment & Plan Note (Signed)
Chronic issue.  I encouraged her to take her MiraLAX daily.  If this is not beneficial she will let us know. ?

## 2021-09-06 ENCOUNTER — Emergency Department: Payer: Medicare HMO

## 2021-09-06 ENCOUNTER — Emergency Department
Admission: EM | Admit: 2021-09-06 | Discharge: 2021-09-06 | Disposition: A | Discharge status: Home / Self Care | Payer: Medicare HMO | Attending: Emergency Medicine | Admitting: Emergency Medicine

## 2021-09-06 DIAGNOSIS — K8050 Calculus of bile duct without cholangitis or cholecystitis without obstruction: Secondary | ICD-10-CM

## 2021-09-06 DIAGNOSIS — I2510 Atherosclerotic heart disease of native coronary artery without angina pectoris: Secondary | ICD-10-CM | POA: Diagnosis not present

## 2021-09-06 DIAGNOSIS — I10 Essential (primary) hypertension: Secondary | ICD-10-CM | POA: Diagnosis not present

## 2021-09-06 DIAGNOSIS — R109 Unspecified abdominal pain: Secondary | ICD-10-CM | POA: Diagnosis not present

## 2021-09-06 DIAGNOSIS — R079 Chest pain, unspecified: Secondary | ICD-10-CM | POA: Diagnosis not present

## 2021-09-06 DIAGNOSIS — K8070 Calculus of gallbladder and bile duct without cholecystitis without obstruction: Secondary | ICD-10-CM | POA: Diagnosis not present

## 2021-09-06 DIAGNOSIS — I700 Atherosclerosis of aorta: Secondary | ICD-10-CM | POA: Diagnosis not present

## 2021-09-06 DIAGNOSIS — K8020 Calculus of gallbladder without cholecystitis without obstruction: Secondary | ICD-10-CM | POA: Diagnosis not present

## 2021-09-06 DIAGNOSIS — M5137 Other intervertebral disc degeneration, lumbosacral region: Secondary | ICD-10-CM | POA: Diagnosis not present

## 2021-09-06 DIAGNOSIS — R1011 Right upper quadrant pain: Secondary | ICD-10-CM | POA: Diagnosis present

## 2021-09-06 DIAGNOSIS — J8410 Pulmonary fibrosis, unspecified: Secondary | ICD-10-CM | POA: Diagnosis not present

## 2021-09-06 LAB — CBC
HCT: 41.3 % (ref 36.0–46.0)
Hemoglobin: 12.8 g/dL (ref 12.0–15.0)
MCH: 26.4 pg (ref 26.0–34.0)
MCHC: 31 g/dL (ref 30.0–36.0)
MCV: 85.3 fL (ref 80.0–100.0)
Platelets: 249 10*3/uL (ref 150–400)
RBC: 4.84 MIL/uL (ref 3.87–5.11)
RDW: 14.1 % (ref 11.5–15.5)
WBC: 9.3 10*3/uL (ref 4.0–10.5)
nRBC: 0 % (ref 0.0–0.2)

## 2021-09-06 LAB — COMPREHENSIVE METABOLIC PANEL
ALT: 20 U/L (ref 0–44)
AST: 32 U/L (ref 15–41)
Albumin: 3.8 g/dL (ref 3.5–5.0)
Alkaline Phosphatase: 79 U/L (ref 38–126)
Anion gap: 5 (ref 5–15)
BUN: 17 mg/dL (ref 8–23)
CO2: 24 mmol/L (ref 22–32)
Calcium: 8.6 mg/dL — ABNORMAL LOW (ref 8.9–10.3)
Chloride: 107 mmol/L (ref 98–111)
Creatinine, Ser: 0.95 mg/dL (ref 0.44–1.00)
GFR, Estimated: >60 mL/min (ref >60)
Glucose, Bld: 141 mg/dL — ABNORMAL HIGH (ref 70–99)
Potassium: 5 mmol/L (ref 3.5–5.1)
Sodium: 136 mmol/L (ref 135–145)
Total Bilirubin: 0.6 mg/dL (ref 0.3–1.2)
Total Protein: 6.8 g/dL (ref 6.5–8.1)

## 2021-09-06 LAB — LIPASE, BLOOD: Lipase: 38 U/L (ref 11–51)

## 2021-09-06 LAB — TROPONIN I (HIGH SENSITIVITY)
Troponin I (High Sensitivity): 4 ng/L (ref <18)
Troponin I (High Sensitivity): 5 ng/L (ref <18)

## 2021-09-06 MED ORDER — ONDANSETRON HCL 4 MG/2ML IJ SOLN
4.0000 mg | Freq: Once | INTRAMUSCULAR | Status: AC
  Administered 2021-09-06: 4 mg via INTRAVENOUS
  Filled 2021-09-06: qty 2

## 2021-09-06 MED ORDER — IOHEXOL 350 MG/ML SOLN
80.0000 mL | Freq: Once | INTRAVENOUS | Status: AC | PRN
  Administered 2021-09-06: 80 mL via INTRAVENOUS

## 2021-09-06 MED ORDER — FENTANYL CITRATE PF 50 MCG/ML IJ SOSY
50.0000 ug | PREFILLED_SYRINGE | Freq: Once | INTRAMUSCULAR | Status: AC
  Administered 2021-09-06: 50 ug via INTRAVENOUS
  Filled 2021-09-06: qty 1

## 2021-09-06 NOTE — ED Triage Notes (Signed)
Patient to ER via [facility] with complaints of RUQ that started one hour ago after eating. Reports this is the second episode of the same. Describes gas like pain, radiates into right shoulder and epigastric area. No hx of abdominal surgery.  ? ?Patient took zofran PTA.  ?

## 2021-09-06 NOTE — ED Provider Notes (Signed)
? ?[facility] ?Provider Note ? ? ? Event Date/Time  ? First MD Initiated Contact with Patient 09/06/21 1706   ?  (approximate) ? ? ?History  ? ?Abdominal Pain ? ? ?HPI ? ?Robin Becker is a 71 y.o. female with hypertension, GERD who comes in with concerns for right upper quadrant pain.  Patient reports severe pain in her right upper quadrant that started 1 hour after eating.  Patient reports that she had an episode like this about a week or 2 ago that lasted about 2 hours and then went away.  Does report still having her gallbladder.  Denies ever having this previously.  The pain is severe, radiates up into her shoulder.  She denies prior abdominal surgery.  Denies feeling short of breath.  Patient does report prior to this happening she did eat some soup with beef in it that did have a lot of fat in it ? ? ?Reviewed patient's office visit from 08/26/2021 where she was seen for her hypertension. ? ? ?Physical Exam  ? ?Triage Vital Signs: ?ED Triage Vitals [09/06/21 1652]  ?Enc Vitals Group  ?   BP (!) 153/77  ?   Pulse Rate 88  ?   Resp 17  ?   Temp 98.4 ?F (36.9 ?C)  ?   Temp Source Oral  ?   SpO2 92 %  ?   Weight 146 lb (66.2 kg)  ?   Height 5' (1.524 m)  ?   Head Circumference   ?   Peak Flow   ?   Pain Score 7  ?   Pain Loc   ?   Pain Edu?   ?   Excl. in [place]?   ? ? ?Most recent vital signs: ?Vitals:  ? 09/06/21 1652  ?BP: (!) 153/77  ?Pulse: 88  ?Resp: 17  ?Temp: 98.4 ?F (36.9 ?C)  ?SpO2: 92%  ? ? ? ?General: Awake, patient appears uncomfortable. ?CV:  Good peripheral perfusion.  ?Resp:  Normal effort.  ?Abd:  No distention.  Tender in the right upper quadrant. ?Other:   ? ? ?ED Results / Procedures / Treatments  ? ?Labs ?(all labs ordered are listed, but only abnormal results are displayed) ?Labs Reviewed  ?COMPREHENSIVE METABOLIC PANEL - Abnormal; Notable for the following components:  ?    Result Value  ? Glucose, Bld 141 (*)   ? Calcium 8.6 (*)   ? All other components within normal  limits  ?LIPASE, BLOOD  ?CBC  ?URINALYSIS, ROUTINE W REFLEX MICROSCOPIC  ?TROPONIN I (HIGH SENSITIVITY)  ? ? ? ?EKG ? ?My interpretation of EKG: ? ?Normal sinus rate of 83 without any ST elevation or T wave inversions, normal intervals ? ?RADIOLOGY ?I have reviewed the ultrasound personally does show gallstones. ? ?Ct pending ? ?PROCEDURES: ? ?Critical Care performed: No ? ?.1-3 Lead EKG Interpretation ?Performed by: Vanessa [place], MD ?Authorized by: Vanessa , MD  ? ?  Interpretation: normal   ?  ECG rate:  80 ?  ECG rate assessment: normal   ?  Rhythm: sinus rhythm   ?  Ectopy: none   ?  Conduction: normal   ? ? ?MEDICATIONS ORDERED IN ED: ?Medications - No data to display ? ? ?IMPRESSION / MDM / ASSESSMENT AND PLAN / ED COURSE  ?I reviewed the triage vital signs and the nursing notes. ? ?Patient comes in with severe right upper quadrant pain radiating to her shoulder.  Labs ordered to evaluate  for any choledocholithiasis, pancreatitis.  X-ray to evaluate for free air, ultrasound to evaluate for gallbladder.  If these are negative we will proceed with CT imaging due to the extent of discomfort.  EKG, troponin ordered to evaluate for ACS.  Patient given some IV fentanyl and IV Zofran due to her being uncomfortable. ? ? ?Tropes negative x2.  Lipase normal.  CMP normal.  White count normal.  No fever ? ?Ultrasound with gallstones ? ?Study Result ? ?Narrative & Impression  ? ?IMPRESSION: ?1. No evidence for aortic dissection or aneurysm. ?2. Coronary artery calcifications noted. ?3. Gallstones. ?4. Aortic Atherosclerosis (ICD10-I70.0). ?   ?IMPRESSION: ?1. Gallstones and sludge within physiologically distended ?gallbladder. There are no secondary findings of acute cholecystitis, ?however the technologist reports a positive sonographic Murphy sign. ?This is of unknown significance in the setting. ?2. Heterogeneous increased hepatic parenchymal echogenicity ?consistent with steatosis. ?  ?Given the coronary artery  calcification I did discuss with the patient check she has follow-up with cardiology next week.  I discussed the case with Dr. Christian Mate given there was some concern for the positive sonographic Murphy sign however patient has had resolution of her pain at this time.  We discussed admission with patient for evaluation of her gallbladder versus going home and having her gallbladder removed outpatient given her symptoms do sound very consistent with biliary colic.  Patient reports that her husband is on hospice and she would really like to be able to return home and have this followed up outpatient.  She continues to deny any pain.  She understands if she develops fevers or worsening pain that she needs to return to the ER as there can always be a chance that this can get infected at any time.  However at this time she would like to trial following up outpatient which I think is reasonable.  Patient is tolerating p.o. and we discussed diet changes until she can get follow-up. ? ?Patient denied any urinary symptoms ?The patient is on the cardiac monitor to evaluate for evidence of arrhythmia and/or significant heart rate changes. ? ?  ? ? ?FINAL CLINICAL IMPRESSION(S) / ED DIAGNOSES  ? ?Final diagnoses:  ?Gallstones  ?Biliary colic  ? ? ? ?Rx / DC Orders  ? ?ED Discharge Orders   ? ? None  ? ?  ? ? ? ?Note:  This document was prepared using Dragon voice recognition software and may include unintentional dictation errors. ?  ?Vanessa Telford, MD ?09/06/21 2152 ? ?

## 2021-09-06 NOTE — Discharge Instructions (Addendum)
Call the surgeon number to get follow-up for your gallbladder.  Avoid foods that can cause flare up such as fatty food, wine, beef, things high in fat, fried food, spicy food.  If you develop return of symptoms, fevers or any other concerns you return to the ER for repeat evaluation ? ?For your abnormal CT scan you should follow-up with cardiology as we discussed ? ?IMPRESSION: ?1. No evidence for aortic dissection or aneurysm. ?2. Coronary artery calcifications noted. ?3. Gallstones. ?4. Aortic Atherosclerosis (ICD10-I70.0). ?  ?  ? ?IMPRESSION: ?1. Gallstones and sludge within physiologically distended ?gallbladder. There are no secondary findings of acute cholecystitis, ?however the technologist reports a positive sonographic Murphy sign. ?This is of unknown significance in the setting. ?2. Heterogeneous increased hepatic parenchymal echogenicity ?consistent with steatosis. ?  ?  ?

## 2021-09-09 DIAGNOSIS — I491 Atrial premature depolarization: Secondary | ICD-10-CM | POA: Insufficient documentation

## 2021-09-09 DIAGNOSIS — I2510 Atherosclerotic heart disease of native coronary artery without angina pectoris: Secondary | ICD-10-CM | POA: Diagnosis not present

## 2021-09-09 DIAGNOSIS — I10 Essential (primary) hypertension: Secondary | ICD-10-CM | POA: Diagnosis not present

## 2021-09-09 DIAGNOSIS — R002 Palpitations: Secondary | ICD-10-CM | POA: Insufficient documentation

## 2021-09-11 ENCOUNTER — Ambulatory Visit: Payer: Self-pay | Admitting: Surgery

## 2021-09-11 ENCOUNTER — Ambulatory Visit (INDEPENDENT_AMBULATORY_CARE_PROVIDER_SITE_OTHER): Payer: Medicare HMO | Admitting: Surgery

## 2021-09-11 ENCOUNTER — Telehealth: Payer: Self-pay | Admitting: Surgery

## 2021-09-11 ENCOUNTER — Encounter: Payer: Self-pay | Admitting: Surgery

## 2021-09-11 ENCOUNTER — Other Ambulatory Visit: Payer: Self-pay

## 2021-09-11 VITALS — BP 165/89 | HR 69 | Temp 98.6°F | Ht 64.0 in | Wt 144.4 lb

## 2021-09-11 DIAGNOSIS — K8010 Calculus of gallbladder with chronic cholecystitis without obstruction: Secondary | ICD-10-CM

## 2021-09-11 NOTE — Telephone Encounter (Signed)
Patient has been advised of Pre-Admission date/time, COVID Testing date and Surgery date. ? ?Surgery Date: 09/12/21, patient to arrive 2 hours early, she is informed to arrive between 11:30 am to 12:00. She is reminded to be NPO after midnight tonight and hold off on any morning meds.  Patient verbalized understanding.    ?Preadmission Testing Date: 09/12/21 arrive 2 hours early.   ?Covid Testing Date: Not needed.   ?

## 2021-09-11 NOTE — H&P (View-Only) (Signed)
Patient ID: Robin Becker, female   DOB: Feb 14, 1951, 70 y.o.   MRN: [id number] ? ?Chief Complaint: Follow-up after emergency department visit. ? ?History of Present Illness ?Robin Becker is a 71 y.o. female with an ED visit prompted by right upper quadrant pain, apparently it began an hour after eating.  She had had prior episodes which spontaneously resolved.  Apparently this followed a beef based soup which appeared to be somewhat on the oily/greasy side.  She denies any history of jaundice, hepatitis, fevers or chills.  Since her presentation she has avoided added oils and butters with the exception of the mistaken bit of mayonnaise on her salad which made her feel somewhat ill. ? ?Past Medical History ?Past Medical History:  ?Diagnosis Date  ? Allergy   ? Seasonal  ? Chicken pox   ? Depression   ? GERD (gastroesophageal reflux disease)   ? Glaucoma   ? s/p surgery  ? History of chicken pox   ? History of pneumonia 2013  ? [place]  ? Hypertension   ? Prediabetes 2011  ? h/o A1c 6.5%  ? Seasonal allergies   ? Smoking   ?  ? ? ?Past Surgical History:  ?Procedure Laterality Date  ? CATARACT EXTRACTION W/PHACO Right 08/27/2015  ? Procedure: CATARACT EXTRACTION PHACO AND INTRAOCULAR LENS PLACEMENT (IOC);  Surgeon: Birder Robson, MD;  Location: [facility] ORS;  Service: Ophthalmology;  Laterality: Right;  [place] 00:53 ?AP% 20.1 ?CDE 10.80 ?fluid pack lot # [id number]  ? [place]  ? COLONOSCOPY    ? ? ?Allergies  ?Allergen Reactions  ? Lipitor [Atorvastatin Calcium]   ?  Nausea   ? ? ?Current Outpatient Medications  ?Medication Sig Dispense Refill  ? Blood Glucose Monitoring Suppl (ACCU-CHEK AVIVA PLUS) w/Device KIT 1 Device by Does not apply route as needed. 1 kit 0  ? esomeprazole (NEXIUM) 20 MG capsule Take 20 mg by mouth daily as needed (Heartburn).    ? fexofenadine (ALLEGRA ALLERGY) 180 MG tablet Take 2 tablets (360 mg total) by mouth at bedtime. (Patient taking differently: Take 360 mg by mouth daily as  needed for allergies.) 180 tablet 1  ? glucose blood (ACCU-CHEK AVIVA) test strip Used to check blood sugars once a day. 50 each 12  ? Lancets (ACCU-CHEK SOFT TOUCH) lancets Use to check blood sugars once a day. 100 each 12  ? olmesartan (BENICAR) 20 MG tablet Take 0.5 tablets (10 mg total) by mouth daily. D/c 20 mg dose (Patient taking differently: Take 20 mg by mouth daily.) 90 tablet 1  ? azelastine (ASTELIN) 0.1 % nasal spray Place 1 spray into both nostrils 2 (two) times daily for 7 days. Use in each nostril as directed (Patient taking differently: Place 1 spray into both nostrils daily as needed for rhinitis. Use in each nostril as directed) 30 mL 0  ? ?No current facility-administered medications for this visit.  ? ? ?Family History ?Family History  ?Problem Relation Age of Onset  ? Heart disease Mother   ? Stroke Mother   ? Cancer Sister 69  ?     colon and otherwise  ? Stroke Maternal Aunt   ? Coronary artery disease Cousin   ? Diabetes Neg Hx   ? Breast cancer Neg Hx   ?  ? ? ?Social History ?Social History  ? ?Tobacco Use  ? Smoking status: Every Day  ?  Packs/day: 0.25  ?  Types: Cigarettes  ? Smokeless tobacco: Never  ?Substance Use Topics  ?  Alcohol use: Yes  ?  Alcohol/week: 0.0 standard drinks  ?  Comment: Regular on weekends  ? Drug use: No  ?  ?  ? ?Review of Systems  ?Constitutional:  Positive for malaise/fatigue.  ?HENT: Negative.    ?Eyes: Negative.   ?Respiratory: Negative.    ?Cardiovascular:  Positive for palpitations.  ?Gastrointestinal:  Positive for abdominal pain, constipation, heartburn and nausea.  ?Genitourinary: Negative.   ?Skin:  Positive for itching and rash.  ?Neurological:  Positive for dizziness.  ?Psychiatric/Behavioral: Negative.     ? ?Physical Exam ?Blood pressure (!) 165/89, pulse 69, temperature 98.6 ?F (37 ?C), temperature source Oral, height _0  (1.626 m), weight 144 lb 6.4 oz (65.5 kg), SpO2 97 %. ?Last Weight  Most recent update: 09/11/2021  9:54 AM  ? ? Weight  ?65.5  kg (144 lb 6.4 oz)  ?      ? ?  ? ? ?CONSTITUTIONAL: Well developed, and nourished, appropriately responsive and aware without distress.   ?EYES: Sclera non-icteric.   ?EARS, NOSE, MOUTH AND THROAT: Mask worn.   The oropharynx is clear. Oral mucosa is pink and moist.    Hearing is intact to voice.  ?NECK: Trachea is midline, and there is no jugular venous distension.  ?LYMPH NODES:  Lymph nodes in the neck are not enlarged. ?RESPIRATORY:  Lungs are clear, and breath sounds are equal bilaterally. Normal respiratory effort without pathologic use of accessory muscles. ?CARDIOVASCULAR: Heart is regular in rate and rhythm. ?GI: The abdomen is soft, nontender, and nondistended. There were no palpable masses. I did not appreciate hepatosplenomegaly. There were normal bowel sounds. ?GU: Infraumbilical scar noted. ?MUSCULOSKELETAL:  Symmetrical muscle tone appreciated in all four extremities.    ?SKIN: Skin turgor is normal. No pathologic skin lesions appreciated.  ?NEUROLOGIC:  Motor and sensation appear grossly normal.  Cranial nerves are grossly without defect. ?PSYCH:  Alert and oriented to person, place and time. Affect is appropriate for situation. ? ?Data Reviewed ?I have personally reviewed what is currently available of the patient's imaging, recent labs and medical records.   ?Labs:  ? ?  Latest Ref Rng & Units 09/06/2021  ?  4:54 PM 06/12/2021  ? 11:46 AM 03/15/2018  ?  6:18 PM  ?CBC  ?WBC 4.0 - 10.5 K/uL 9.3   8.2   9.9    ?Hemoglobin 12.0 - 15.0 g/dL 12.8   13.4   12.8    ?Hematocrit 36.0 - 46.0 % 41.3   41.9   40.2    ?Platelets 150 - 400 K/uL 249   282.0   279    ? ? ?  Latest Ref Rng & Units 09/06/2021  ?  4:54 PM 06/12/2021  ? 11:46 AM 09/13/2020  ? 10:31 AM  ?CMP  ?Glucose 70 - 99 mg/dL 141   86   85    ?BUN 8 - 23 mg/dL _1 ?Creatinine 0.44 - 1.00 mg/dL 0.95   0.65   0.63    ?Sodium 135 - 145 mmol/L 136   142   142    ?Potassium 3.5 - 5.1 mmol/L 5.0   4.2   3.9    ?Chloride 98 - 111 mmol/L 107   104    105    ?CO2 22 - 32 mmol/L _2 ?Calcium 8.9 - 10.3 mg/dL 8.6   9.9   9.4    ?Total Protein  6.5 - 8.1 g/dL 6.8   7.0   6.7    ?Total Bilirubin 0.3 - 1.2 mg/dL 0.6   0.4   0.4    ?Alkaline Phos 38 - 126 U/L 79   79   86    ?AST 15 - 41 U/L 32   17   15    ?ALT 0 - 44 U/L _0 ? ? ? ? ?Imaging: ?Radiology review:  ?CLINICAL DATA:  Abdominal pain for 2 hours. ?  ?EXAM: ?ULTRASOUND ABDOMEN LIMITED RIGHT UPPER QUADRANT ?  ?COMPARISON:  Abdominal ultrasound 12/12/2015 ?  ?FINDINGS: ?Gallbladder: ?  ?Physiologically distended containing sludge and gallstones. No ?gallbladder wall thickening. No pericholecystic fluid. Positive ?sonographic Murphy sign noted by sonographer. ?  ?Common bile duct: ?  ?Diameter: 2 mm. ?  ?Liver: ?  ?No focal lesion identified. Heterogeneous and increased in ?parenchymal echogenicity. Portal vein is patent on color Doppler ?imaging with normal direction of blood flow towards the liver. ?  ?Other: No right upper quadrant ascites. ?  ?IMPRESSION: ?1. Gallstones and sludge within physiologically distended ?gallbladder. There are no secondary findings of acute cholecystitis, ?however the technologist reports a positive sonographic Murphy sign. ?This is of unknown significance in the setting. ?2. Heterogeneous increased hepatic parenchymal echogenicity ?consistent with steatosis. ?  ?  ?Electronically Signed ?  By: Keith Rake M.D. ?  On: 09/06/2021 17:41 ?Within last 24 hrs: No results found. ? ?CTA imaging of abdomen and pelvis report reviewed.  Chest x-ray reviewed from September 06, 2021 as well. ? ? ?Assessment ?   ?Chronic calculus cholecystitis with biliary colic ? ?Patient Active Problem List  ? Diagnosis Date Noted  ? Constipation 06/12/2021  ? Family history of colon cancer 09/13/2020  ? Depression, major, single episode, mild ([place]) 10/13/2017  ? 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 Essential hypertension 12/18/2015  ? OA (osteoarthritis) 12/18/2015  ? Prediabetes  12/18/2015  ? Hypopigmented skin lesion 12/02/2014  ? New onset of headaches after age 55   ? GERD (gastroesophageal reflux disease)   ? Seasonal allergies   ? HLD (hyperlipidemia)   ? Nicotine dependence, cigare

## 2021-09-11 NOTE — Patient Instructions (Addendum)
Pick up your medication at the pharmacy. ? ?Advised to pursue a goal of 25 to 30 g of fiber daily.  Made aware that the majority of this may be through natural sources, but advised to be aware of actual consumption and to ensure minimal consumption by daily supplementation.  Various forms of supplements discussed.  Recommended Psyllium husk, that mixes well with applesauce, or the powder which goes down well shaken with chocolate milk.  ?Strongly advised to consume more fluids to ensure adequate hydration, instructed to watch color of urine to determine adequacy of hydration.  Clarity is pursued in urine output, and bowel activity that correlates to significant meal intake.   ?We need to avoid deferring having bowel movements, advised to take the time at the first sign of sensation, typically following meals, and in the morning.   ?Subsequent utilization of MiraLAX may be needed ensure at least daily movement, ideally twice daily bowel movements.  If multiple doses of MiraLAX are necessary utilize them. ?Never skip a day...  ?To be regular, we must do the above EVERY day.  ? ? ?Our surgery scheduler will call you within 24-48 hours to schedule your schedule. Please have the [facility] surgery sheet available when speaking with her.  ? ? ? ?Minimally Invasive Cholecystectomy ?Minimally invasive cholecystectomy is surgery to remove the gallbladder. The gallbladder is a pear-shaped organ that lies beneath the liver on the right side of the body. The gallbladder stores bile, which is a fluid that helps the body digest fats. Cholecystectomy is often done to treat inflammation (irritation and swelling) of the gallbladder (cholecystitis). This condition is usually caused by a buildup of gallstones (cholelithiasis) in the gallbladder or when the fluid in the gall bladder becomes stagnant because gallstones get stuck in the ducts (tubes) and block the flow of bile. This can result in inflammation and pain. In severe cases,  emergency surgery may be required. ?This procedure is done through small incisions in the abdomen, instead of one large incision. It is also called laparoscopic surgery. A thin scope with a camera (laparoscope) is inserted through one incision. Then surgical instruments are inserted through the other incisions. In some cases, a minimally invasive surgery may need to be changed to a surgery that is done through a larger incision. This is called open surgery. ?Tell a health care provider about: ?Any allergies you have. ?All medicines you are taking, including vitamins, herbs, eye drops, creams, and over-the-counter medicines. ?Any problems you or family members have had with anesthetic medicines. ?Any bleeding problems you have. ?Any surgeries you have had. ?Any medical conditions you have. ?Whether you are pregnant or may be pregnant. ?What are the risks? ?Generally, this is a safe procedure. However, problems may occur, including: ?Infection. ?Bleeding. ?Allergic reactions to medicines. ?Damage to nearby structures or organs. ?A gallstone remaining in the common bile duct. The common bile duct carries bile from the gallbladder to the small intestine. ?A bile leak from the liver or cystic duct after your gallbladder is removed. ?What happens before the procedure? ?When to stop eating and drinking ?Follow instructions from your health care provider about what you may eat and drink before your procedure. These may include: ?8 hours before the procedure ?Stop eating most foods. Do not eat meat, fried foods, or fatty foods. ?Eat only light foods, such as toast or crackers. ?All liquids are okay except energy drinks and alcohol. ?6 hours before the procedure ?Stop eating. ?Drink only clear liquids, such as water, clear  fruit juice, black coffee, plain tea, and sports drinks. ?Do not drink energy drinks or alcohol. ?2 hours before the procedure ?Stop drinking all liquids. ?You may be allowed to take medicines with small  sips of water. ?If you do not follow your health care provider's instructions, your procedure may be delayed or canceled. ?Medicines ?Ask your health care provider about: ?Changing or stopping your regular medicines. This is especially important if you are taking diabetes medicines or blood thinners. ?Taking medicines such as aspirin and ibuprofen. These medicines can thin your blood. Do not take these medicines unless your health care provider tells you to take them. ?Taking over-the-counter medicines, vitamins, herbs, and supplements. ?General instructions ?If you will be going home right after the procedure, plan to have a responsible adult: ?Take you home from the hospital or clinic. You will not be allowed to drive. ?Care for you for the time you are told. ?Do not use any products that contain nicotine or tobacco for at least 4 weeks before the procedure. These products include cigarettes, chewing tobacco, and vaping devices, such as e-cigarettes. If you need help quitting, ask your health care provider. ?Ask your health care provider: ?How your surgery site will be marked. ?What steps will be taken to help prevent infection. These may include: ?Removing hair at the surgery site. ?Washing skin with a germ-killing soap. ?Taking antibiotic medicine. ?What happens during the procedure? ? ?An IV will be inserted into one of your veins. ?You will be given one or both of the following: ?A medicine to help you relax (sedative). ?A medicine to make you fall asleep (general anesthetic). ?Your surgeon will make several small incisions in your abdomen. ?The laparoscope will be inserted through one of the small incisions. The camera on the laparoscope will send images to a monitor in the operating room. This lets your surgeon see inside your abdomen. ?A gas will be pumped into your abdomen. This will expand your abdomen to give the surgeon more room to perform the surgery. ?Other tools that are needed for the procedure  will be inserted through the other incisions. The gallbladder will be removed through one of the incisions. ?Your common bile duct may be examined. If stones are found in the common bile duct, they may be removed. ?After your gallbladder has been removed, the incisions will be closed with stitches (sutures), staples, or skin glue. ?Your incisions will be covered with a bandage (dressing). ?The procedure may vary among health care providers and hospitals. ?What happens after the procedure? ?Your blood pressure, heart rate, breathing rate, and blood oxygen level will be monitored until you leave the hospital or clinic. ?You will be given medicines as needed to control your pain. ?You may have a drain placed in the incision. The drain will be removed a day or two after the procedure. ?Summary ?Minimally invasive cholecystectomy, also called laparoscopic cholecystectomy, is surgery to remove the gallbladder using small incisions. ?Tell your health care provider about all the medical conditions you have and all the medicines you are taking for those conditions. ?Before the procedure, follow instructions about when to stop eating and drinking and changing or stopping medicines. ?Plan to have a responsible adult care for you for the time you are told after you leave the hospital or clinic. ?This information is not intended to replace advice given to you by your health care provider. Make sure you discuss any questions you have with your health care provider. ?Document Revised: 10/29/2020 Document Reviewed: 10/29/2020 ?  Elsevier Patient Education ? [place]. ?Cholelithiasis ? ?Cholelithiasis happens when gallstones form in the gallbladder. The gallbladder stores bile. Bile is a fluid that helps digest fats. Bile can harden and form into gallstones. If they cause a blockage, they can cause pain (gallbladder attack). ?What are the causes? ?This condition may be caused by: ?Some blood diseases, such as sickle cell  anemia. ?Too much of a fat-like substance (cholesterol) in your bile. ?Not enough bile salts in your bile. These salts help the body absorb and digest fats. ?The gallbladder not emptying fully or often enough. This i

## 2021-09-11 NOTE — Progress Notes (Signed)
Patient ID: Robin Becker, female   DOB: 02/03/1951, 70 y.o.   MRN: [id number] ? ?Chief Complaint: Follow-up after emergency department visit. ? ?History of Present Illness ?Robin Becker is a 70 y.o. female with an ED visit prompted by right upper quadrant pain, apparently it began an hour after eating.  She had had prior episodes which spontaneously resolved.  Apparently this followed a beef based soup which appeared to be somewhat on the oily/greasy side.  She denies any history of jaundice, hepatitis, fevers or chills.  Since her presentation she has avoided added oils and butters with the exception of the mistaken bit of mayonnaise on her salad which made her feel somewhat ill. ? ?Past Medical History ?Past Medical History:  ?Diagnosis Date  ? Allergy   ? Seasonal  ? Chicken pox   ? Depression   ? GERD (gastroesophageal reflux disease)   ? Glaucoma   ? s/p surgery  ? History of chicken pox   ? History of pneumonia 2013  ? [facility]  ? Hypertension   ? Prediabetes 2011  ? h/o A1c 6.5%  ? Seasonal allergies   ? Smoking   ?  ? ? ?Past Surgical History:  ?Procedure Laterality Date  ? CATARACT EXTRACTION W/PHACO Right 08/27/2015  ? Procedure: CATARACT EXTRACTION PHACO AND INTRAOCULAR LENS PLACEMENT (IOC);  Surgeon: William Porfilio, MD;  Location: [facility] ORS;  Service: Ophthalmology;  Laterality: Right;  US 00:53 ?AP% 20.1 ?CDE 10.80 ?fluid pack lot # [id number]  ? CESAREAN SECTION  1977  ? COLONOSCOPY    ? ? ?Allergies  ?Allergen Reactions  ? Lipitor [Atorvastatin Calcium]   ?  Nausea   ? ? ?Current Outpatient Medications  ?Medication Sig Dispense Refill  ? Blood Glucose Monitoring Suppl (ACCU-CHEK AVIVA PLUS) w/Device KIT 1 Device by Does not apply route as needed. 1 kit 0  ? esomeprazole (NEXIUM) 20 MG capsule Take 20 mg by mouth daily as needed (Heartburn).    ? fexofenadine (ALLEGRA ALLERGY) 180 MG tablet Take 2 tablets (360 mg total) by mouth at bedtime. (Patient taking differently: Take 360 mg by mouth daily as  needed for allergies.) 180 tablet 1  ? glucose blood (ACCU-CHEK AVIVA) test strip Used to check blood sugars once a day. 50 each 12  ? Lancets (ACCU-CHEK SOFT TOUCH) lancets Use to check blood sugars once a day. 100 each 12  ? olmesartan (BENICAR) 20 MG tablet Take 0.5 tablets (10 mg total) by mouth daily. D/c 20 mg dose (Patient taking differently: Take 20 mg by mouth daily.) 90 tablet 1  ? azelastine (ASTELIN) 0.1 % nasal spray Place 1 spray into both nostrils 2 (two) times daily for 7 days. Use in each nostril as directed (Patient taking differently: Place 1 spray into both nostrils daily as needed for rhinitis. Use in each nostril as directed) 30 mL 0  ? ?No current facility-administered medications for this visit.  ? ? ?Family History ?Family History  ?Problem Relation Age of Onset  ? Heart disease Mother   ? Stroke Mother   ? Cancer Sister 56  ?     colon and otherwise  ? Stroke Maternal Aunt   ? Coronary artery disease Cousin   ? Diabetes Neg Hx   ? Breast cancer Neg Hx   ?  ? ? ?Social History ?Social History  ? ?Tobacco Use  ? Smoking status: Every Day  ?  Packs/day: 0.25  ?  Types: Cigarettes  ? Smokeless tobacco: Never  ?Substance Use Topics  ?   Alcohol use: Yes  ?  Alcohol/week: 0.0 standard drinks  ?  Comment: Regular on weekends  ? Drug use: No  ?  ?  ? ?Review of Systems  ?Constitutional:  Positive for malaise/fatigue.  ?HENT: Negative.    ?Eyes: Negative.   ?Respiratory: Negative.    ?Cardiovascular:  Positive for palpitations.  ?Gastrointestinal:  Positive for abdominal pain, constipation, heartburn and nausea.  ?Genitourinary: Negative.   ?Skin:  Positive for itching and rash.  ?Neurological:  Positive for dizziness.  ?Psychiatric/Behavioral: Negative.     ? ?Physical Exam ?Blood pressure (!) 165/89, pulse 69, temperature 98.6 ?F (37 ?C), temperature source Oral, height 5' 4" (1.626 m), weight 144 lb 6.4 oz (65.5 kg), SpO2 97 %. ?Last Weight  Most recent update: 09/11/2021  9:54 AM  ? ? Weight  ?65.5  kg (144 lb 6.4 oz)  ?      ? ?  ? ? ?CONSTITUTIONAL: Well developed, and nourished, appropriately responsive and aware without distress.   ?EYES: Sclera non-icteric.   ?EARS, NOSE, MOUTH AND THROAT: Mask worn.   The oropharynx is clear. Oral mucosa is pink and moist.    Hearing is intact to voice.  ?NECK: Trachea is midline, and there is no jugular venous distension.  ?LYMPH NODES:  Lymph nodes in the neck are not enlarged. ?RESPIRATORY:  Lungs are clear, and breath sounds are equal bilaterally. Normal respiratory effort without pathologic use of accessory muscles. ?CARDIOVASCULAR: Heart is regular in rate and rhythm. ?GI: The abdomen is soft, nontender, and nondistended. There were no palpable masses. I did not appreciate hepatosplenomegaly. There were normal bowel sounds. ?GU: Infraumbilical scar noted. ?MUSCULOSKELETAL:  Symmetrical muscle tone appreciated in all four extremities.    ?SKIN: Skin turgor is normal. No pathologic skin lesions appreciated.  ?NEUROLOGIC:  Motor and sensation appear grossly normal.  Cranial nerves are grossly without defect. ?PSYCH:  Alert and oriented to person, place and time. Affect is appropriate for situation. ? ?Data Reviewed ?I have personally reviewed what is currently available of the patient's imaging, recent labs and medical records.   ?Labs:  ? ?  Latest Ref Rng & Units 09/06/2021  ?  4:54 PM 06/12/2021  ? 11:46 AM 03/15/2018  ?  6:18 PM  ?CBC  ?WBC 4.0 - 10.5 K/uL 9.3   8.2   9.9    ?Hemoglobin 12.0 - 15.0 g/dL 12.8   13.4   12.8    ?Hematocrit 36.0 - 46.0 % 41.3   41.9   40.2    ?Platelets 150 - 400 K/uL 249   282.0   279    ? ? ?  Latest Ref Rng & Units 09/06/2021  ?  4:54 PM 06/12/2021  ? 11:46 AM 09/13/2020  ? 10:31 AM  ?CMP  ?Glucose 70 - 99 mg/dL 141   86   85    ?BUN 8 - 23 mg/dL 17   15   19    ?Creatinine 0.44 - 1.00 mg/dL 0.95   0.65   0.63    ?Sodium 135 - 145 mmol/L 136   142   142    ?Potassium 3.5 - 5.1 mmol/L 5.0   4.2   3.9    ?Chloride 98 - 111 mmol/L 107   104    105    ?CO2 22 - 32 mmol/L 24   31   29    ?Calcium 8.9 - 10.3 mg/dL 8.6   9.9   9.4    ?Total Protein   6.5 - 8.1 g/dL 6.8   7.0   6.7    ?Total Bilirubin 0.3 - 1.2 mg/dL 0.6   0.4   0.4    ?Alkaline Phos 38 - 126 U/L 79   79   86    ?AST 15 - 41 U/L 32   17   15    ?ALT 0 - 44 U/L 20   14   13    ? ? ? ? ?Imaging: ?Radiology review:  ?CLINICAL DATA:  Abdominal pain for 2 hours. ?  ?EXAM: ?ULTRASOUND ABDOMEN LIMITED RIGHT UPPER QUADRANT ?  ?COMPARISON:  Abdominal ultrasound 12/12/2015 ?  ?FINDINGS: ?Gallbladder: ?  ?Physiologically distended containing sludge and gallstones. No ?gallbladder wall thickening. No pericholecystic fluid. Positive ?sonographic Murphy sign noted by sonographer. ?  ?Common bile duct: ?  ?Diameter: 2 mm. ?  ?Liver: ?  ?No focal lesion identified. Heterogeneous and increased in ?parenchymal echogenicity. Portal vein is patent on color Doppler ?imaging with normal direction of blood flow towards the liver. ?  ?Other: No right upper quadrant ascites. ?  ?IMPRESSION: ?1. Gallstones and sludge within physiologically distended ?gallbladder. There are no secondary findings of acute cholecystitis, ?however the technologist reports a positive sonographic Murphy sign. ?This is of unknown significance in the setting. ?2. Heterogeneous increased hepatic parenchymal echogenicity ?consistent with steatosis. ?  ?  ?Electronically Signed ?  By: Melanie  Sanford M.D. ?  On: 09/06/2021 17:41 ?Within last 24 hrs: No results found. ? ?CTA imaging of abdomen and pelvis report reviewed.  Chest x-ray reviewed from September 06, 2021 as well. ? ? ?Assessment ?   ?Chronic calculus cholecystitis with biliary colic ? ?Patient Active Problem List  ? Diagnosis Date Noted  ? Constipation 06/12/2021  ? Family history of colon cancer 09/13/2020  ? Depression, major, single episode, mild (HCC) 10/13/2017  ? 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 Essential hypertension 12/18/2015  ? OA (osteoarthritis) 12/18/2015  ? Prediabetes  12/18/2015  ? Hypopigmented skin lesion 12/02/2014  ? New onset of headaches after age 50   ? GERD (gastroesophageal reflux disease)   ? Seasonal allergies   ? HLD (hyperlipidemia)   ? Nicotine dependence, cigare

## 2021-09-12 ENCOUNTER — Ambulatory Visit: Payer: Medicare HMO | Admitting: Certified Registered"

## 2021-09-12 ENCOUNTER — Other Ambulatory Visit: Payer: Self-pay

## 2021-09-12 ENCOUNTER — Encounter
Admission: RE | Disposition: A | Discharge status: Home / Self Care | Payer: Self-pay | Source: Home / Self Care | Attending: Surgery

## 2021-09-12 ENCOUNTER — Encounter: Payer: Self-pay | Admitting: Surgery

## 2021-09-12 ENCOUNTER — Ambulatory Visit
Admission: RE | Admit: 2021-09-12 | Discharge: 2021-09-12 | Disposition: A | Discharge status: Home / Self Care | Payer: Medicare HMO | Attending: Surgery | Admitting: Surgery

## 2021-09-12 DIAGNOSIS — K8010 Calculus of gallbladder with chronic cholecystitis without obstruction: Secondary | ICD-10-CM

## 2021-09-12 DIAGNOSIS — I10 Essential (primary) hypertension: Secondary | ICD-10-CM | POA: Diagnosis not present

## 2021-09-12 DIAGNOSIS — K8064 Calculus of gallbladder and bile duct with chronic cholecystitis without obstruction: Secondary | ICD-10-CM | POA: Diagnosis not present

## 2021-09-12 DIAGNOSIS — F17210 Nicotine dependence, cigarettes, uncomplicated: Secondary | ICD-10-CM | POA: Insufficient documentation

## 2021-09-12 DIAGNOSIS — L501 Idiopathic urticaria: Secondary | ICD-10-CM

## 2021-09-12 DIAGNOSIS — K8070 Calculus of gallbladder and bile duct without cholecystitis without obstruction: Secondary | ICD-10-CM | POA: Diagnosis not present

## 2021-09-12 DIAGNOSIS — Z79899 Other long term (current) drug therapy: Secondary | ICD-10-CM | POA: Diagnosis not present

## 2021-09-12 DIAGNOSIS — K219 Gastro-esophageal reflux disease without esophagitis: Secondary | ICD-10-CM | POA: Diagnosis not present

## 2021-09-12 SURGERY — CHOLECYSTECTOMY, ROBOT-ASSISTED, LAPAROSCOPIC
Anesthesia: General | Site: Abdomen

## 2021-09-12 MED ORDER — DEXAMETHASONE SODIUM PHOSPHATE 10 MG/ML IJ SOLN
INTRAMUSCULAR | Status: DC | PRN
  Administered 2021-09-12: 5 mg via INTRAVENOUS

## 2021-09-12 MED ORDER — PROPOFOL 10 MG/ML IV BOLUS
INTRAVENOUS | Status: DC | PRN
  Administered 2021-09-12: 150 mg via INTRAVENOUS

## 2021-09-12 MED ORDER — DROPERIDOL 2.5 MG/ML IJ SOLN: 0.6250 mg | Freq: Once | INTRAMUSCULAR | Status: DC | PRN

## 2021-09-12 MED ORDER — FEXOFENADINE HCL 180 MG PO TABS: 180.0000 mg | ORAL_TABLET | Freq: Every day | ORAL | 1 refills | Status: DC

## 2021-09-12 MED ORDER — KETAMINE HCL 50 MG/5ML IJ SOSY
PREFILLED_SYRINGE | INTRAMUSCULAR | Status: AC
Start: 2021-09-12 — End: ?
  Filled 2021-09-12: qty 5

## 2021-09-12 MED ORDER — FENTANYL CITRATE (PF) 100 MCG/2ML IJ SOLN
INTRAMUSCULAR | Status: AC
  Filled 2021-09-12: qty 2

## 2021-09-12 MED ORDER — CHLORHEXIDINE GLUCONATE 0.12 % MT SOLN
OROMUCOSAL | Status: AC
  Filled 2021-09-12: qty 15

## 2021-09-12 MED ORDER — CHLORHEXIDINE GLUCONATE CLOTH 2 % EX PADS: 6.0000 | MEDICATED_PAD | Freq: Once | CUTANEOUS | Status: DC

## 2021-09-12 MED ORDER — FENTANYL CITRATE (PF) 100 MCG/2ML IJ SOLN
INTRAMUSCULAR | Status: DC | PRN
  Administered 2021-09-12 (×4): 50 ug via INTRAVENOUS

## 2021-09-12 MED ORDER — LIDOCAINE HCL (PF) 2 % IJ SOLN
INTRAMUSCULAR | Status: AC
  Filled 2021-09-12: qty 5

## 2021-09-12 MED ORDER — PROMETHAZINE HCL 25 MG/ML IJ SOLN: 6.2500 mg | INTRAMUSCULAR | Status: DC | PRN

## 2021-09-12 MED ORDER — FENTANYL CITRATE (PF) 100 MCG/2ML IJ SOLN
INTRAMUSCULAR | Status: AC
  Administered 2021-09-12: 25 ug via INTRAVENOUS
  Filled 2021-09-12: qty 2

## 2021-09-12 MED ORDER — OXYCODONE HCL 5 MG PO TABS: 5.0000 mg | ORAL_TABLET | Freq: Once | ORAL | Status: AC | PRN

## 2021-09-12 MED ORDER — ACETAMINOPHEN 10 MG/ML IV SOLN: 1000.0000 mg | Freq: Once | INTRAVENOUS | Status: DC | PRN

## 2021-09-12 MED ORDER — ROCURONIUM BROMIDE 100 MG/10ML IV SOLN
INTRAVENOUS | Status: DC | PRN
  Administered 2021-09-12: 35 mg via INTRAVENOUS

## 2021-09-12 MED ORDER — SUCCINYLCHOLINE CHLORIDE 200 MG/10ML IV SOSY
PREFILLED_SYRINGE | INTRAVENOUS | Status: AC
  Filled 2021-09-12: qty 10

## 2021-09-12 MED ORDER — CHLORHEXIDINE GLUCONATE 0.12 % MT SOLN
15.0000 mL | Freq: Once | OROMUCOSAL | Status: AC
  Administered 2021-09-12: 15 mL via OROMUCOSAL

## 2021-09-12 MED ORDER — IBUPROFEN 800 MG PO TABS: 800.0000 mg | ORAL_TABLET | Freq: Three times a day (TID) | ORAL | 0 refills | Status: DC | PRN

## 2021-09-12 MED ORDER — ACETAMINOPHEN 500 MG PO TABS
ORAL_TABLET | ORAL | Status: AC
  Filled 2021-09-12: qty 2

## 2021-09-12 MED ORDER — CEFAZOLIN SODIUM-DEXTROSE 2-4 GM/100ML-% IV SOLN
2.0000 g | INTRAVENOUS | Status: AC
  Administered 2021-09-12: 2 g via INTRAVENOUS

## 2021-09-12 MED ORDER — LIDOCAINE HCL (CARDIAC) PF 100 MG/5ML IV SOSY
PREFILLED_SYRINGE | INTRAVENOUS | Status: DC | PRN
  Administered 2021-09-12: 80 mg via INTRAVENOUS

## 2021-09-12 MED ORDER — KETAMINE HCL 10 MG/ML IJ SOLN
INTRAMUSCULAR | Status: DC | PRN
  Administered 2021-09-12: 50 mg via INTRAVENOUS

## 2021-09-12 MED ORDER — ORAL CARE MOUTH RINSE: 15.0000 mL | Freq: Once | OROMUCOSAL | Status: AC

## 2021-09-12 MED ORDER — SUCCINYLCHOLINE CHLORIDE 200 MG/10ML IV SOSY
PREFILLED_SYRINGE | INTRAVENOUS | Status: DC | PRN
  Administered 2021-09-12: 100 mg via INTRAVENOUS

## 2021-09-12 MED ORDER — BUPIVACAINE-EPINEPHRINE (PF) 0.25% -1:200000 IJ SOLN
INTRAMUSCULAR | Status: DC | PRN
Start: 2021-09-12 — End: 2021-09-12
  Administered 2021-09-12: 30 mL via PERINEURAL

## 2021-09-12 MED ORDER — CELECOXIB 200 MG PO CAPS
ORAL_CAPSULE | ORAL | Status: AC
  Filled 2021-09-12: qty 1

## 2021-09-12 MED ORDER — CEFAZOLIN SODIUM-DEXTROSE 2-4 GM/100ML-% IV SOLN
INTRAVENOUS | Status: AC
  Filled 2021-09-12: qty 100

## 2021-09-12 MED ORDER — PHENYLEPHRINE HCL (PRESSORS) 10 MG/ML IV SOLN
INTRAVENOUS | Status: DC | PRN
  Administered 2021-09-12: 120 ug via INTRAVENOUS
  Administered 2021-09-12 (×2): 80 ug via INTRAVENOUS

## 2021-09-12 MED ORDER — INDOCYANINE GREEN 25 MG IV SOLR
1.2500 mg | Freq: Once | INTRAVENOUS | Status: AC
  Administered 2021-09-12: 1.25 mg via INTRAVENOUS
  Filled 2021-09-12: qty 0.5

## 2021-09-12 MED ORDER — OXYCODONE HCL 5 MG/5ML PO SOLN: 5.0000 mg | Freq: Once | ORAL | Status: AC | PRN

## 2021-09-12 MED ORDER — ROCURONIUM BROMIDE 10 MG/ML (PF) SYRINGE
PREFILLED_SYRINGE | INTRAVENOUS | Status: AC
  Filled 2021-09-12: qty 10

## 2021-09-12 MED ORDER — LACTATED RINGERS IV SOLN: INTRAVENOUS | Status: DC

## 2021-09-12 MED ORDER — 0.9 % SODIUM CHLORIDE (POUR BTL) OPTIME
TOPICAL | Status: DC | PRN
Start: 2021-09-12 — End: 2021-09-12
  Administered 2021-09-12: 500 mL

## 2021-09-12 MED ORDER — BUPIVACAINE-EPINEPHRINE (PF) 0.25% -1:200000 IJ SOLN
INTRAMUSCULAR | Status: AC
  Filled 2021-09-12: qty 30

## 2021-09-12 MED ORDER — ONDANSETRON HCL 4 MG/2ML IJ SOLN
INTRAMUSCULAR | Status: DC | PRN
  Administered 2021-09-12: 4 mg via INTRAVENOUS

## 2021-09-12 MED ORDER — BUPIVACAINE LIPOSOME 1.3 % IJ SUSP: 20.0000 mL | Freq: Once | INTRAMUSCULAR | Status: DC

## 2021-09-12 MED ORDER — CELECOXIB 200 MG PO CAPS
200.0000 mg | ORAL_CAPSULE | ORAL | Status: AC
  Administered 2021-09-12: 200 mg via ORAL

## 2021-09-12 MED ORDER — DEXAMETHASONE SODIUM PHOSPHATE 10 MG/ML IJ SOLN
INTRAMUSCULAR | Status: AC
  Filled 2021-09-12: qty 1

## 2021-09-12 MED ORDER — ACETAMINOPHEN 500 MG PO TABS
1000.0000 mg | ORAL_TABLET | ORAL | Status: AC
  Administered 2021-09-12: 1000 mg via ORAL

## 2021-09-12 MED ORDER — OXYCODONE HCL 5 MG PO TABS: 5.0000 mg | ORAL_TABLET | Freq: Four times a day (QID) | ORAL | 0 refills | Status: DC | PRN

## 2021-09-12 MED ORDER — OXYCODONE HCL 5 MG PO TABS
ORAL_TABLET | ORAL | Status: AC
  Administered 2021-09-12: 5 mg via ORAL
  Filled 2021-09-12: qty 1

## 2021-09-12 MED ORDER — ONDANSETRON HCL 4 MG/2ML IJ SOLN
INTRAMUSCULAR | Status: AC
  Filled 2021-09-12: qty 2

## 2021-09-12 MED ORDER — GABAPENTIN 300 MG PO CAPS
300.0000 mg | ORAL_CAPSULE | ORAL | Status: AC
  Administered 2021-09-12: 300 mg via ORAL

## 2021-09-12 MED ORDER — SUGAMMADEX SODIUM 200 MG/2ML IV SOLN
INTRAVENOUS | Status: DC | PRN
  Administered 2021-09-12: 140 mg via INTRAVENOUS

## 2021-09-12 MED ORDER — FENTANYL CITRATE (PF) 100 MCG/2ML IJ SOLN
25.0000 ug | INTRAMUSCULAR | Status: DC | PRN
  Administered 2021-09-12: 50 ug via INTRAVENOUS
  Administered 2021-09-12 (×2): 25 ug via INTRAVENOUS

## 2021-09-12 MED ORDER — PROPOFOL 10 MG/ML IV BOLUS
INTRAVENOUS | Status: AC
  Filled 2021-09-12: qty 20

## 2021-09-12 MED ORDER — GABAPENTIN 300 MG PO CAPS
ORAL_CAPSULE | ORAL | Status: AC
  Filled 2021-09-12: qty 1

## 2021-09-12 SURGICAL SUPPLY — 47 items
BAG RETRIEVAL 10 (BASKET) ×1
CLIP LIGATING HEM O LOK PURPLE (MISCELLANEOUS) ×2 IMPLANT
COVER TIP SHEARS 8 DVNC (MISCELLANEOUS) ×1 IMPLANT
COVER TIP SHEARS 8MM DA VINCI (MISCELLANEOUS) ×1
DERMABOND ADVANCED (GAUZE/BANDAGES/DRESSINGS) ×1
DERMABOND ADVANCED .7 DNX12 (GAUZE/BANDAGES/DRESSINGS) ×1 IMPLANT
DRAPE ARM DVNC X/XI (DISPOSABLE) ×4 IMPLANT
DRAPE COLUMN DVNC XI (DISPOSABLE) ×1 IMPLANT
DRAPE DA VINCI XI ARM (DISPOSABLE) ×4
DRAPE DA VINCI XI COLUMN (DISPOSABLE) ×1
ELECT CAUTERY BLADE 6.4 (BLADE) ×2 IMPLANT
GLOVE ORTHO TXT STRL SZ7.5 (GLOVE) ×4 IMPLANT
GOWN STRL REUS W/ TWL LRG LVL3 (GOWN DISPOSABLE) ×2 IMPLANT
GOWN STRL REUS W/ TWL XL LVL3 (GOWN DISPOSABLE) ×2 IMPLANT
GOWN STRL REUS W/TWL LRG LVL3 (GOWN DISPOSABLE) ×2
GOWN STRL REUS W/TWL XL LVL3 (GOWN DISPOSABLE) ×2
GRASPER SUT TROCAR 14GX15 (MISCELLANEOUS) IMPLANT
INFUSOR MANOMETER BAG 3000ML (MISCELLANEOUS) IMPLANT
IRRIGATION STRYKERFLOW (MISCELLANEOUS) IMPLANT
IRRIGATOR STRYKERFLOW (MISCELLANEOUS)
IRRIGATOR SUCT 8 DISP DVNC XI (IRRIGATION / IRRIGATOR) IMPLANT
IRRIGATOR SUCTION 8MM XI DISP (IRRIGATION / IRRIGATOR)
IV NS IRRIG 3000ML ARTHROMATIC (IV SOLUTION) IMPLANT
KIT PINK PAD W/HEAD ARE REST (MISCELLANEOUS) ×2
KIT PINK PAD W/HEAD ARM REST (MISCELLANEOUS) ×1 IMPLANT
KIT TURNOVER KIT A (KITS) ×2 IMPLANT
LABEL OR SOLS (LABEL) ×2 IMPLANT
MANIFOLD NEPTUNE II (INSTRUMENTS) ×2 IMPLANT
NDL INSUFFLATION 14GA 120MM (NEEDLE) IMPLANT
NEEDLE HYPO 22GX1.5 SAFETY (NEEDLE) ×2 IMPLANT
NEEDLE INSUFFLATION 14GA 120MM (NEEDLE) ×2 IMPLANT
NS IRRIG 500ML POUR BTL (IV SOLUTION) ×2 IMPLANT
PACK LAP CHOLECYSTECTOMY (MISCELLANEOUS) ×2 IMPLANT
PENCIL ELECTRO HAND CTR (MISCELLANEOUS) ×2 IMPLANT
SEAL CANN UNIV 5-8 DVNC XI (MISCELLANEOUS) ×4 IMPLANT
SEAL XI 5MM-8MM UNIVERSAL (MISCELLANEOUS) ×4
SET TUBE SMOKE EVAC HIGH FLOW (TUBING) ×2 IMPLANT
SOLUTION ELECTROLUBE (MISCELLANEOUS) ×2 IMPLANT
SPIKE FLUID TRANSFER (MISCELLANEOUS) ×2 IMPLANT
SUT MNCRL 4-0 (SUTURE) ×1
SUT MNCRL 4-0 27XMFL (SUTURE) ×1
SUT VICRYL 0 AB UR-6 (SUTURE) ×2 IMPLANT
SUTURE MNCRL 4-0 27XMF (SUTURE) ×1 IMPLANT
SYS BAG RETRIEVAL 10MM (BASKET) ×1
SYSTEM BAG RETRIEVAL 10MM (BASKET) ×1 IMPLANT
TROCAR Z-THREAD FIOS 11X100 BL (TROCAR) IMPLANT
WATER STERILE IRR 500ML POUR (IV SOLUTION) ×2 IMPLANT

## 2021-09-12 NOTE — Discharge Instructions (Signed)

## 2021-09-12 NOTE — Anesthesia Procedure Notes (Signed)
Procedure Name: Intubation ?Date/Time: 09/12/2021 12:36 PM ?Performed by: Fredderick Phenix, CRNA ?Pre-anesthesia Checklist: Patient identified, Emergency Drugs available, Suction available and Patient being monitored ?Patient Re-evaluated:Patient Re-evaluated prior to induction ?Oxygen Delivery Method: Circle system utilized ?Preoxygenation: Pre-oxygenation with 100% oxygen ?Induction Type: IV induction ?Ventilation: Mask ventilation without difficulty ?Laryngoscope Size: McGraph and 3 ?Grade View: Grade I ?Tube type: Oral ?Tube size: 7.0 mm ?Number of attempts: 1 ?Airway Equipment and Method: Stylet and Oral airway ?Placement Confirmation: ETT inserted through vocal cords under direct vision, positive ETCO2 and breath sounds checked- equal and bilateral ?Secured at: 21 cm ?Tube secured with: Tape ?Dental Injury: Teeth and Oropharynx as per pre-operative assessment  ? ? ? ? ?

## 2021-09-12 NOTE — Interval H&P Note (Signed)
History and Physical Interval Note: ? ?09/12/2021 ?12:01 PM ? ?Robin Becker  has presented today for surgery, with the diagnosis of gallstones, biliary colic.  The various methods of treatment have been discussed with the patient and family. After consideration of risks, benefits and other options for treatment, the patient has consented to  Procedure(s): ?XI ROBOTIC ASSISTED LAPAROSCOPIC CHOLECYSTECTOMY (N/A) ?INDOCYANINE GREEN FLUORESCENCE IMAGING (ICG) (N/A) as a surgical intervention.  The patient's history has been reviewed, patient examined, no change in status, stable for surgery.  I have reviewed the patient's chart and labs.  Questions were answered to the patient's satisfaction.   ? ? ?Robin Becker ? ? ?

## 2021-09-12 NOTE — Transfer of Care (Signed)
Immediate Anesthesia Transfer of Care Note ? ?Patient: Robin Becker ? ?Procedure(s) Performed: XI ROBOTIC ASSISTED LAPAROSCOPIC CHOLECYSTECTOMY (Abdomen) ?INDOCYANINE GREEN FLUORESCENCE IMAGING (ICG) (Abdomen) ? ?Patient Location: PACU ? ?Anesthesia Type:General ? ?Level of Consciousness: awake and drowsy ? ?Airway & Oxygen Therapy: Patient Spontanous Breathing and Patient connected to face mask oxygen ? ?Post-op Assessment: Report given to RN and Post -op Vital signs reviewed and stable ? ?Post vital signs: Reviewed and stable ? ?Last Vitals:  ?Vitals Value Taken Time  ?BP 162/66 09/12/21 1336  ?Temp    ?Pulse 88 09/12/21 1340  ?Resp 14 09/12/21 1340  ?SpO2 100 % 09/12/21 1340  ?Vitals shown include unvalidated device data. ? ?Last Pain:  ?Vitals:  ? 09/12/21 1200  ?TempSrc: Oral  ?PainSc: 0-No pain  ?   ? ?  ? ?Complications: No notable events documented. ?

## 2021-09-12 NOTE — Anesthesia Preprocedure Evaluation (Addendum)
Anesthesia Evaluation  ?Patient identified by MRN, date of birth, ID band ?Patient awake ? ? ? ?Reviewed: ?Allergy & Precautions, NPO status , Patient's Chart, lab work & pertinent test results ? ?Airway ?Mallampati: III ? ?TM Distance: >3 FB ?Neck ROM: full ? ? ? Dental ? ?(+) Missing ?  ?Pulmonary ?Current Smoker and Patient abstained from smoking.,  ?  ?Pulmonary exam normal ? ? ? ? ? ? ? Cardiovascular ?Exercise Tolerance: Good ?hypertension, Pt. on medications ?Normal cardiovascular exam ? ?Pt with recent complaints of Heart palpitations?  ?Coronary artery calcification seen on CT scan?  ?Premature atrial contractions. Seen by cardiologist with holter monitor.  Holter monitor removed today. Pt states her palpitations are only occurring when she has right upper quadrant pain. ?  ?Neuro/Psych ?negative neurological ROS ? negative psych ROS  ? GI/Hepatic ?Neg liver ROS, GERD  Medicated and Controlled,  ?Endo/Other  ?negative endocrine ROS ? Renal/GU ?  ? ?  ?Musculoskeletal ? ? Abdominal ?Normal abdominal exam  (+)   ?Peds ? Hematology ?negative hematology ROS ?(+)   ?Anesthesia Other Findings ?gallstones, biliary colic ? ?Past Medical History: ?No date: Allergy ?    Comment:  Seasonal ?No date: Chicken pox ?No date: Depression ?No date: GERD (gastroesophageal reflux disease) ?No date: Glaucoma ?    Comment:  s/p surgery ?No date: History of chicken pox ?2013: History of pneumonia ?    Comment:  [facility] ?No date: Hypertension ?2011: Prediabetes ?    Comment:  h/o A1c 6.5% ?No date: Seasonal allergies ?No date: Smoking ? ?Past Surgical History: ?08/27/2015: CATARACT EXTRACTION W/PHACO; Right ?    Comment:  Procedure: CATARACT EXTRACTION PHACO AND INTRAOCULAR  ?             LENS PLACEMENT ([place]);  Surgeon: Birder Robson, MD;   ?             Location: [facility] ORS;  Service: Ophthalmology;  Laterality: ?             Right;  [place] 00:53 ?AP% 20.1 ?CDE 10.80 ?fluid pack lot #  ?              [id number] ?1977: CESAREAN SECTION ?No date: COLONOSCOPY ? ? ? ? Reproductive/Obstetrics ?negative OB ROS ? ?  ? ? ? ? ? ? ? ? ? ? ? ? ? ?  ?  ? ? ? ? ? ? ?Anesthesia Physical ?Anesthesia Plan ? ?ASA: 2 ? ?Anesthesia Plan: General ETT  ? ?Post-op Pain Management: Tylenol PO (pre-op)*, Gabapentin PO (pre-op)* and Celebrex PO (pre-op)*  ? ?Induction:  ? ?PONV Risk Score and Plan: Ondansetron, Dexamethasone, Midazolam and Treatment may vary due to age or medical condition ? ?Airway Management Planned: Oral ETT ? ?Additional Equipment:  ? ?Intra-op Plan:  ? ?Post-operative Plan: Extubation in OR ? ?Informed Consent: I have reviewed the patients History and Physical, chart, labs and discussed the procedure including the risks, benefits and alternatives for the proposed anesthesia with the patient or authorized representative who has indicated his/her understanding and acceptance.  ? ? ? ?Dental advisory given ? ?Plan Discussed with: Anesthesiologist, CRNA and Surgeon ? ?Anesthesia Plan Comments:   ? ? ? ? ? ?Anesthesia Quick Evaluation ? ?

## 2021-09-12 NOTE — Interval H&P Note (Signed)
History and Physical Interval Note: ? ?09/12/2021 ?12:06 PM ? ?Robin Becker  has presented today for surgery, with the diagnosis of gallstones, biliary colic.  The various methods of treatment have been discussed with the patient and family. After consideration of risks, benefits and other options for treatment, the patient has consented to  Procedure(s): ?XI ROBOTIC ASSISTED LAPAROSCOPIC CHOLECYSTECTOMY (N/A) ?INDOCYANINE GREEN FLUORESCENCE IMAGING (ICG) (N/A) as a surgical intervention.  The patient's history has been reviewed, patient examined, no change in status, stable for surgery.  I have reviewed the patient's chart and labs.  Questions were answered to the patient's satisfaction.   ? ? ?[name] ? ? ?

## 2021-09-12 NOTE — Op Note (Signed)
Robotic cholecystectomy with Indocyamine Green Ductal Imaging.  ? ?Pre-operative Diagnosis: Chronic calculus cholecystitis ? ?Post-operative Diagnosis:  Same. ? ?Procedure: Robotic assisted laparoscopic cholecystectomy with Indocyamine Green Ductal Imaging.  ? ?Surgeon: [name], M.D., FACS ? ?Anesthesia: General. with endotracheal tube ? ?Findings: As expected, adhesions consistent with same. ? ?Estimated Blood Loss: 15 mL ?        ?Drains: None ?        ?Specimens: Gallbladder     ?      ?Complications: none ? ?Procedure Details  ?The patient was seen again in the Holding Room.  2.5 mg dose of ICG was administered intravenously.   ?The benefits, complications, treatment options, risks and expected outcomes were again reviewed with the patient. The likelihood of improving the patient's symptoms with return to their baseline status is good.  The patient and/or family concurred with the proposed plan, giving informed consent, again alternatives reviewed.  The patient was taken to Operating Room, identified, and the procedure verified as robotic assisted laparoscopic cholecystectomy. ? ?Prior to the induction of general anesthesia, antibiotic prophylaxis was administered. VTE prophylaxis was in place. General endotracheal anesthesia was then administered and tolerated well. The patient was positioned in the supine position.  After the induction, the abdomen was prepped with Chloraprep and draped in the sterile fashion.  ?A Time Out was held and the above information confirmed. ?After local infiltration of quarter percent Marcaine with epinephrine, stab incision was made left upper quadrant.  Just below the costal margin at Palmer's point, approximately midclavicular line the Veres needle is passed with sensation of the layers to penetrate the abdominal wall and into the peritoneum.  Saline drop test is confirmed peritoneal placement.  Insufflation is initiated with carbon dioxide to pressures of 15  mmHg. ? ?Right infra-umbilical local infiltration with quarter percent Marcaine with epinephrine is utilized.  Made a 12 mm incision on the right periumbilical site, I advanced an optical 15m port under direct visualization into the peritoneal cavity.  Once the peritoneum was penetrated, insufflation was initiated.  The trocar was then advanced into the abdominal cavity under direct visualization. ?Pneumoperitoneum was then continued utilizing CO2 at 15 mmHg or less and tolerated well without any adverse changes in the patient's vital signs.  Two 8.5-mm ports were placed in the left lower quadrant and laterally, and one to the right lower quadrant, all under direct vision. All skin incisions  were infiltrated with a local anesthetic agent before making the incision and placing the trocars.  ?The patient was positioned  in reverse Trendelenburg, tilted the patient's left side down.  Da Vinci XI robot was then positioned on to the patient's left side, and docked. ? ?The gallbladder was identified, the fundus grasped via the arm 4 Prograsp and retracted cephalad. Adhesions were lysed with scissors and cautery.  The infundibulum was identified grasped and retracted laterally, exposing the peritoneum overlying the triangle of Calot. This was then opened and dissected using cautery & scissors. An extended critical view of the cystic duct and cystic artery was obtained, aided by the ICG via FireFly which enabled ready visualization of the ductal anatomy.   ? ?The cystic duct was clearly identified and dissected to isolation.   Artery well isolated and clipped, and the cystic duct was triple clipped and divided with scissors, as close to the gallbladder neck as feasible, thus leaving two on the remaining stump.  The specimen side of the artery is sealed with bipolar and divided with monopolar  scissors.  ? ?The gallbladder was taken from the gallbladder fossa in a retrograde fashion with the electrocautery. The gallbladder  was removed and placed in an Endocatch bag.  ?The liver bed is inspected. Hemostasis was confirmed.  ?The robot was undocked and moved away from the operative field. ?No irrigation was utilized.   The gallbladder and Endocatch sac were then removed through the infraumbilical port site.  ? ?Inspection of the right upper quadrant was performed. No bleeding, bile duct injury or leak, or bowel injury was noted. The infra-umbilical port site fascia was closed with interrumpted 0 Vicryl sutures using PMI/cone under direct visualization. Pneumoperitoneum was released and ports removed.  4-0 subcuticular Monocryl was used to close the skin. Dermabond was  applied.  The patient was then extubated and brought to the recovery room in stable condition. Sponge, lap, and needle counts were correct at closure and at the conclusion of the case.  ?        ?     ?[name], M.D., FACS ?09/12/2021 1:32 PM  ?

## 2021-09-15 LAB — SURGICAL PATHOLOGY

## 2021-09-15 NOTE — Anesthesia Postprocedure Evaluation (Signed)
Anesthesia Post Note ? ?Patient: Robin Becker ? ?Procedure(s) Performed: XI ROBOTIC ASSISTED LAPAROSCOPIC CHOLECYSTECTOMY (Abdomen) ?INDOCYANINE GREEN FLUORESCENCE IMAGING (ICG) (Abdomen) ? ?Patient location during evaluation: PACU ?Anesthesia Type: General ?Level of consciousness: awake and alert ?Pain management: pain level controlled ?Vital Signs Assessment: post-procedure vital signs reviewed and stable ?Respiratory status: spontaneous breathing, nonlabored ventilation and respiratory function stable ?Cardiovascular status: blood pressure returned to baseline and stable ?Postop Assessment: no apparent nausea or vomiting ?Anesthetic complications: no ? ? ?No notable events documented. ? ? ?Last Vitals:  ?Vitals:  ? 09/12/21 1430 09/12/21 1443  ?BP: (!) 143/63 (!) 148/75  ?Pulse: 76 79  ?Resp: 14 18  ?Temp:  36.5 ?C  ?SpO2: 96% 97%  ?  ?Last Pain:  ?Vitals:  ? 09/12/21 1443  ?TempSrc: Temporal  ?PainSc: 3   ? ? ?  ?  ?  ?  ?  ?  ? ?[place] Ouch ? ? ? ? ?

## 2021-09-17 DIAGNOSIS — Z800 Family history of malignant neoplasm of digestive organs: Secondary | ICD-10-CM | POA: Diagnosis not present

## 2021-09-17 DIAGNOSIS — K219 Gastro-esophageal reflux disease without esophagitis: Secondary | ICD-10-CM | POA: Diagnosis not present

## 2021-09-17 DIAGNOSIS — Z86010 Personal history of colonic polyps: Secondary | ICD-10-CM | POA: Insufficient documentation

## 2021-09-24 HISTORY — PX: CHOLECYSTECTOMY: SHX55

## 2021-09-25 ENCOUNTER — Encounter: Payer: Self-pay | Admitting: Physician Assistant

## 2021-09-25 ENCOUNTER — Other Ambulatory Visit: Payer: Self-pay

## 2021-09-25 ENCOUNTER — Ambulatory Visit (INDEPENDENT_AMBULATORY_CARE_PROVIDER_SITE_OTHER): Payer: Medicare HMO | Admitting: Physician Assistant

## 2021-09-25 VITALS — BP 121/69 | HR 68 | Temp 98.2°F | Ht 64.0 in | Wt 144.6 lb

## 2021-09-25 DIAGNOSIS — Z09 Encounter for follow-up examination after completed treatment for conditions other than malignant neoplasm: Secondary | ICD-10-CM

## 2021-09-25 DIAGNOSIS — K8010 Calculus of gallbladder with chronic cholecystitis without obstruction: Secondary | ICD-10-CM

## 2021-09-25 NOTE — Patient Instructions (Signed)

## 2021-09-25 NOTE — Progress Notes (Signed)
[facility] POST-OP OFFICE VISIT  09/25/2021  HPI: Inda Mcglothen is a 71 y.o. female 13 days s/p robotic assisted laparoscopic cholecystectomy for [facility] with Dr Christian Mate   She is doing very well; no complaints today No fever, chills, nausea, emesis, or diarrhea Incisions are healing well No other complaints  Vital signs: BP 121/69   Pulse 68   Temp 98.2 F (36.8 C) (Oral)   Ht '5\' 4"'$  (1.626 m)   Wt 144 lb 9.6 oz (65.6 kg)   SpO2 96%   BMI 24.82 kg/m    Physical Exam: Constitutional: Well appearing female, NAD Abdomen: Soft, non-tender, non-distended, no rebound/guarding Skin: Laparoscopic incisions are healing well, no erythema or drainage   Assessment/Plan: This is a 71 y.o. female 13 days s/p robotic assisted laparoscopic cholecystectomy for [place] with Dr Christian Mate    - Pain control prn  - Reviewed wound care recommendation  - Reviewed lifting restrictions; 4 weeks total  - Reviewed surgical pathology; [place]  - She can follow up on as needed basis; She understands to call with questions/concerns  -- Edison Simon, PA-C [facility] 09/25/2021, 1:24 PM M-F: 7am - 4pm

## 2021-10-02 DIAGNOSIS — I491 Atrial premature depolarization: Secondary | ICD-10-CM | POA: Diagnosis not present

## 2021-10-03 DIAGNOSIS — R002 Palpitations: Secondary | ICD-10-CM | POA: Diagnosis not present

## 2021-10-03 DIAGNOSIS — I10 Essential (primary) hypertension: Secondary | ICD-10-CM | POA: Diagnosis not present

## 2021-10-03 DIAGNOSIS — I2510 Atherosclerotic heart disease of native coronary artery without angina pectoris: Secondary | ICD-10-CM | POA: Diagnosis not present

## 2021-12-02 ENCOUNTER — Other Ambulatory Visit: Payer: Self-pay | Admitting: Internal Medicine

## 2021-12-02 DIAGNOSIS — I10 Essential (primary) hypertension: Secondary | ICD-10-CM

## 2021-12-23 ENCOUNTER — Encounter: Payer: Self-pay | Admitting: Internal Medicine

## 2021-12-24 ENCOUNTER — Ambulatory Visit: Payer: Medicare HMO | Admitting: Anesthesiology

## 2021-12-24 ENCOUNTER — Encounter
Admission: RE | Disposition: A | Discharge status: Home / Self Care | Payer: Self-pay | Source: Home / Self Care | Attending: Internal Medicine

## 2021-12-24 ENCOUNTER — Ambulatory Visit
Admission: RE | Admit: 2021-12-24 | Discharge: 2021-12-24 | Disposition: A | Discharge status: Home / Self Care | Payer: Medicare HMO | Attending: Internal Medicine | Admitting: Internal Medicine

## 2021-12-24 ENCOUNTER — Encounter: Payer: Self-pay | Admitting: Internal Medicine

## 2021-12-24 DIAGNOSIS — I10 Essential (primary) hypertension: Secondary | ICD-10-CM | POA: Insufficient documentation

## 2021-12-24 DIAGNOSIS — K219 Gastro-esophageal reflux disease without esophagitis: Secondary | ICD-10-CM | POA: Diagnosis not present

## 2021-12-24 DIAGNOSIS — K5909 Other constipation: Secondary | ICD-10-CM | POA: Insufficient documentation

## 2021-12-24 DIAGNOSIS — Z79899 Other long term (current) drug therapy: Secondary | ICD-10-CM | POA: Diagnosis not present

## 2021-12-24 DIAGNOSIS — Z800 Family history of malignant neoplasm of digestive organs: Secondary | ICD-10-CM | POA: Insufficient documentation

## 2021-12-24 DIAGNOSIS — I2510 Atherosclerotic heart disease of native coronary artery without angina pectoris: Secondary | ICD-10-CM | POA: Diagnosis not present

## 2021-12-24 DIAGNOSIS — Z1211 Encounter for screening for malignant neoplasm of colon: Secondary | ICD-10-CM | POA: Diagnosis not present

## 2021-12-24 DIAGNOSIS — F17200 Nicotine dependence, unspecified, uncomplicated: Secondary | ICD-10-CM | POA: Insufficient documentation

## 2021-12-24 DIAGNOSIS — Z86010 Personal history of colonic polyps: Secondary | ICD-10-CM | POA: Diagnosis not present

## 2021-12-24 DIAGNOSIS — K648 Other hemorrhoids: Secondary | ICD-10-CM | POA: Diagnosis not present

## 2021-12-24 DIAGNOSIS — K640 First degree hemorrhoids: Secondary | ICD-10-CM | POA: Diagnosis not present

## 2021-12-24 HISTORY — PX: COLONOSCOPY: SHX5424

## 2021-12-24 HISTORY — PX: ESOPHAGOGASTRODUODENOSCOPY: SHX5428

## 2021-12-24 SURGERY — COLONOSCOPY
Anesthesia: General

## 2021-12-24 MED ORDER — LIDOCAINE HCL (CARDIAC) PF 100 MG/5ML IV SOSY
PREFILLED_SYRINGE | INTRAVENOUS | Status: DC | PRN
  Administered 2021-12-24: 60 mg via INTRAVENOUS

## 2021-12-24 MED ORDER — MIDAZOLAM HCL 2 MG/2ML IJ SOLN
INTRAMUSCULAR | Status: AC
  Filled 2021-12-24: qty 2

## 2021-12-24 MED ORDER — PROPOFOL 1000 MG/100ML IV EMUL
INTRAVENOUS | Status: AC
  Filled 2021-12-24: qty 100

## 2021-12-24 MED ORDER — PROPOFOL 10 MG/ML IV BOLUS
INTRAVENOUS | Status: DC | PRN
Start: 2021-12-24 — End: 2021-12-24
  Administered 2021-12-24: 60 mg via INTRAVENOUS

## 2021-12-24 MED ORDER — PROPOFOL 500 MG/50ML IV EMUL
INTRAVENOUS | Status: DC | PRN
  Administered 2021-12-24: 140 ug/kg/min via INTRAVENOUS

## 2021-12-24 MED ORDER — SODIUM CHLORIDE 0.9 % IV SOLN: INTRAVENOUS | Status: DC

## 2021-12-24 MED ORDER — LIDOCAINE HCL (PF) 2 % IJ SOLN
INTRAMUSCULAR | Status: AC
  Filled 2021-12-24: qty 5

## 2021-12-24 NOTE — Op Note (Signed)
[facility] Gastroenterology Patient Name: Robin Becker Procedure Date: 12/24/2021 3:05 PM MRN: [id number] Account #: [id number] Date of Birth: 27-Nov-1950 Admit Type: Outpatient Age: 71 Room: [facility] ENDO ROOM 2 Gender: Female Note Status: Finalized Instrument Name: Jasper Riling [id number] Procedure:             Colonoscopy Indications:           High risk colon cancer surveillance: Personal history                         of non-advanced adenoma Providers:             Benay Pike. Dvante Hands MD, MD Medicines:             Propofol per Anesthesia Complications:         No immediate complications. Procedure:             Pre-Anesthesia Assessment:                        - The risks and benefits of the procedure and the                         sedation options and risks were discussed with the                         patient. All questions were answered and informed                         consent was obtained.                        - Patient identification and proposed procedure were                         verified prior to the procedure by the nurse. The                         procedure was verified in the procedure room.                        - ASA Grade Assessment: III - A patient with severe                         systemic disease.                        - After reviewing the risks and benefits, the patient                         was deemed in satisfactory condition to undergo the                         procedure.                        After obtaining informed consent, the colonoscope was                         passed under direct vision. Throughout the procedure,  the patient's blood pressure, pulse, and oxygen                         saturations were monitored continuously. The                         Colonoscope was introduced through the anus and                         advanced to the the cecum, identified by appendiceal                          orifice and ileocecal valve. The colonoscopy was                         performed without difficulty. The ileocecal valve,                         appendiceal orifice, and rectum were photographed. The                         quality of the bowel preparation was good. Findings:      The perianal and digital rectal examinations were normal. Pertinent       negatives include normal sphincter tone and no palpable rectal lesions.      Non-bleeding internal hemorrhoids were found during retroflexion. The       hemorrhoids were Grade I (internal hemorrhoids that do not prolapse).      The colon (entire examined portion) appeared normal. Impression:            - Non-bleeding internal hemorrhoids.                        - The entire examined colon is normal.                        - No specimens collected. Recommendation:        - Patient has a contact number available for                         emergencies. The signs and symptoms of potential                         delayed complications were discussed with the patient.                         Return to normal activities tomorrow. Written                         discharge instructions were provided to the patient.                        - Resume previous diet.                        - Continue present medications.                        - No repeat colonoscopy due to current age (54 years  or older) and the absence of colonic polyps.                        - You do NOT require further colon cancer screening                         measures (Annual stool testing (i.e. hemoccult, FIT,                         cologuard), sigmoidoscopy, colonoscopy or CT                         colonography). You should share this recommendation                         with your Primary Care provider.                        - Return to GI office PRN.                        - The findings and recommendations were discussed with                          the patient. Procedure Code(s):     --- Professional ---                        F5732, Colorectal cancer screening; colonoscopy on                         individual at high risk Diagnosis Code(s):     --- Professional ---                        K64.0, First degree hemorrhoids                        Z86.010, Personal history of colonic polyps CPT copyright 2019 American Medical Association. All rights reserved. The codes documented in this report are preliminary and upon coder review may  be revised to meet current compliance requirements. Efrain Sella MD, MD 12/24/2021 3:28:12 PM This report has been signed electronically. Number of Addenda: 0 Note Initiated On: 12/24/2021 3:05 PM Scope Withdrawal Time: 0 hours 4 minutes 26 seconds  Total Procedure Duration: 0 hours 8 minutes 22 seconds  Estimated Blood Loss:  Estimated blood loss: none.      [facility]

## 2021-12-24 NOTE — Transfer of Care (Signed)
Immediate Anesthesia Transfer of Care Note  Patient: Robin Becker  Procedure(s) Performed: COLONOSCOPY ESOPHAGOGASTRODUODENOSCOPY (EGD)  Patient Location: Endoscopy Unit  Anesthesia Type:General  Level of Consciousness: awake, alert  and oriented  Airway & Oxygen Therapy: Patient Spontanous Breathing  Post-op Assessment: Report given to RN and Post -op Vital signs reviewed and stable  Post vital signs: Reviewed and stable  Last Vitals:  Vitals Value Taken Time  BP 91/42   Temp    Pulse 71   Resp 14   SpO2 100     Last Pain:  Vitals:   12/24/21 1331  TempSrc: Temporal  PainSc: 0-No pain         Complications: No notable events documented.

## 2021-12-24 NOTE — Interval H&P Note (Signed)
History and Physical Interval Note:  12/24/2021 3:08 PM  Robin Becker  has presented today for surgery, with the diagnosis of Hx of adenomatous colonic polyps (Z86.010) Gastroesophageal reflux disease, unspecified whether esophagitis present (K21.9).  The various methods of treatment have been discussed with the patient and family. After consideration of risks, benefits and other options for treatment, the patient has consented to  Procedure(s) with comments: COLONOSCOPY (N/A) ESOPHAGOGASTRODUODENOSCOPY (EGD) (N/A) - [place] as a surgical intervention.  The patient's history has been reviewed, patient examined, no change in status, stable for surgery.  I have reviewed the patient's chart and labs.  Questions were answered to the patient's satisfaction.     [place], [place]

## 2021-12-24 NOTE — Anesthesia Procedure Notes (Signed)
Date/Time: 12/24/2021 3:09 PM  Performed by: Lily Peer, Genevra Orne, CRNAPre-anesthesia Checklist: Patient identified, Emergency Drugs available, Suction available, Patient being monitored and Timeout performed Patient Re-evaluated:Patient Re-evaluated prior to induction Oxygen Delivery Method: Nasal cannula Induction Type: IV induction

## 2021-12-24 NOTE — H&P (Signed)
Outpatient short stay form Pre-procedure 12/24/2021 8:48 AM Robin Becker K. Robin Becker, M.D.  Primary Physician: Robin Becker, M.D.  Reason for visit:  Personal history of colon polyps, family hx of colon cancer  History of present illness:  71 y/o patient with FH colon cancer. Patient has a history of chronic constipation and she takes MiraLAX daily with good results. She has a bowel movement about every other day. She has had interruption in her diet with recent gallstones and postop course and her constipation worsened over the last 2 weeks. She was advised to add prune juice to the daily Miralax and that has resolved the problem. She has noted a fleeting deep rectal pain about 1-2 per month lasting one minute and occurring at night. No hemorrhoidal symptoms, itching or burning or hematochezia. During this timeframe, she has been also under a great deal of stress taking care of her very ill husband. No weight loss, anorexia, abdominal pain, or hematochezia.   07/25/2015 colonoscopy: Indic: Heme positive stool, FH colon cancer in sister: Imp: nonthrombosed external hemorrhoid, 2 polyps, internal hemorrhoid. Pathology shows sessile serrated adenoma and one fragment. Colonic mucosa with focal granular hyperplasia and one fragment. Repeat in 5 years.    No current facility-administered medications for this encounter.  Current Outpatient Medications:    azelastine (ASTELIN) 0.1 % nasal spray, Place 1 spray into both nostrils 2 (two) times daily for 7 days. Use in each nostril as directed (Patient taking differently: Place 1 spray into both nostrils daily as needed for rhinitis. Use in each nostril as directed), Disp: 30 mL, Rfl: 0   Blood Glucose Monitoring Suppl (ACCU-CHEK AVIVA PLUS) w/Device KIT, 1 Device by Does not apply route as needed., Disp: 1 kit, Rfl: 0   esomeprazole (NEXIUM) 20 MG capsule, Take 20 mg by mouth daily as needed (Heartburn)., Disp: , Rfl:    fexofenadine (ALLEGRA ALLERGY) 180 MG  tablet, Take 1 tablet (180 mg total) by mouth at bedtime. Have patient review this medication with PCP soon as available.  It appears she has been taking it chronically at high doses 5 times normal.  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 D/C 20 MG DOSE, Disp: 90 tablet, Rfl: 1  No medications prior to admission.     Allergies  Allergen Reactions   Lipitor [Atorvastatin Calcium]     Nausea      Past Medical History:  Diagnosis Date   Allergy    Seasonal   Chicken pox    Depression    GERD (gastroesophageal reflux disease)    Glaucoma    s/p surgery   History of chicken pox    History of pneumonia 2013   [place]   Hypertension    Prediabetes 2011   h/o A1c 6.5%   Seasonal allergies    Smoking     Review of systems:  Otherwise negative.    Physical Exam  Gen: Alert, oriented. Appears stated age.  HEENT: [place]/AT. PERRLA. Lungs: CTA,  no wheezes. CV: RR nl S1, S2. Abd: soft, benign, no masses. BS+ Ext: No edema. Pulses 2+    Planned procedures: Proceed with colonoscopy. The patient understands the nature of the planned procedure, indications, risks, alternatives and potential complications including but not limited to bleeding, infection, perforation, damage to internal organs and possible oversedation/side effects from anesthesia. The patient agrees and gives consent to proceed.  Please refer to procedure notes for findings, recommendations and patient disposition/instructions.     Robin Becker K. Robin Becker,  M.D. Gastroenterology 12/24/2021  8:48 AM

## 2021-12-24 NOTE — Anesthesia Preprocedure Evaluation (Addendum)
Anesthesia Evaluation  Patient identified by MRN, date of birth, ID band Patient awake    Reviewed: Allergy & Precautions, NPO status , Patient's Chart, lab work & pertinent test results  Airway Mallampati: I  TM Distance: >3 FB Neck ROM: full    Dental  (+) Missing   Pulmonary Current Smoker and Patient abstained from smoking.,    Pulmonary exam normal        Cardiovascular Exercise Tolerance: Good hypertension, Pt. on medications + CAD (seen on CT scan)  Normal cardiovascular exam  Pt with recent complaints of Heart palpitations  Coronary artery calcification seen on CT scan  Premature atrial contractions. Seen by cardiologist with holter monitor.  Holter monitor removed today. Pt states her palpitations are only occurring when she has right upper quadrant pain.   Neuro/Psych PSYCHIATRIC DISORDERS Depression negative neurological ROS     GI/Hepatic negative GI ROS, Neg liver ROS,   Endo/Other  Prediabetes h/o A1c 6.5%  Renal/GU      Musculoskeletal  (+) Arthritis ,   Abdominal Normal abdominal exam  (+)   Peds  Hematology negative hematology ROS (+)   Anesthesia Other Findings Past Medical History: No date: Allergy     Comment:  Seasonal No date: Chicken pox No date: Depression No date: GERD (gastroesophageal reflux disease) No date: Glaucoma     Comment:  s/p surgery No date: History of chicken pox 2013: History of pneumonia     Comment:  [facility] No date: Hypertension 2011: Prediabetes     Comment:  h/o A1c 6.5% No date: Seasonal allergies No date: Smoking  Past Surgical History: 08/27/2015: CATARACT EXTRACTION W/PHACO; Right     Comment:  Procedure: CATARACT EXTRACTION PHACO AND INTRAOCULAR               LENS PLACEMENT (IOC);  Surgeon: Birder Robson, MD;                Location: [facility] ORS;  Service: Ophthalmology;  Laterality:              Right;  [place] 00:53 AP% 20.1 CDE 10.80 fluid pack lot #                [id number] H 1977: CESAREAN SECTION No date: COLONOSCOPY     Reproductive/Obstetrics negative OB ROS                            Anesthesia Physical  Anesthesia Plan  ASA: 2  Anesthesia Plan: General   Post-op Pain Management: Minimal or no pain anticipated   Induction: Intravenous  PONV Risk Score and Plan: Treatment may vary due to age or medical condition  Airway Management Planned: Natural Airway  Additional Equipment:   Intra-op Plan:   Post-operative Plan:   Informed Consent: I have reviewed the patients History and Physical, chart, labs and discussed the procedure including the risks, benefits and alternatives for the proposed anesthesia with the patient or authorized representative who has indicated his/her understanding and acceptance.     Dental advisory given  Plan Discussed with: Anesthesiologist, CRNA and Surgeon  Anesthesia Plan Comments:        Anesthesia Quick Evaluation

## 2021-12-25 ENCOUNTER — Encounter: Payer: Self-pay | Admitting: Internal Medicine

## 2021-12-25 NOTE — Anesthesia Postprocedure Evaluation (Signed)
Anesthesia Post Note  Patient: Robin Becker  Procedure(s) Performed: COLONOSCOPY ESOPHAGOGASTRODUODENOSCOPY (EGD)  Patient location during evaluation: PACU Anesthesia Type: General Level of consciousness: awake and alert Pain management: pain level controlled Vital Signs Assessment: post-procedure vital signs reviewed and stable Respiratory status: spontaneous breathing, nonlabored ventilation and respiratory function stable Cardiovascular status: blood pressure returned to baseline and stable Postop Assessment: no apparent nausea or vomiting Anesthetic complications: no   No notable events documented.   Last Vitals:  Vitals:   12/24/21 1538 12/24/21 1546  BP: (!) 128/59 123/64  Pulse: 75 67  Resp: 19 18  Temp: (!) 35.7 C   SpO2: 100% 100%    Last Pain:  Vitals:   12/24/21 1546  TempSrc:   PainSc: 0-No pain                 [place] Ouch

## 2022-02-03 DIAGNOSIS — I2510 Atherosclerotic heart disease of native coronary artery without angina pectoris: Secondary | ICD-10-CM | POA: Diagnosis not present

## 2022-02-03 DIAGNOSIS — I491 Atrial premature depolarization: Secondary | ICD-10-CM | POA: Diagnosis not present

## 2022-02-03 DIAGNOSIS — I10 Essential (primary) hypertension: Secondary | ICD-10-CM | POA: Diagnosis not present

## 2022-02-03 DIAGNOSIS — R002 Palpitations: Secondary | ICD-10-CM | POA: Diagnosis not present

## 2022-02-03 DIAGNOSIS — Z23 Encounter for immunization: Secondary | ICD-10-CM | POA: Diagnosis not present

## 2022-04-07 DIAGNOSIS — Z79899 Other long term (current) drug therapy: Secondary | ICD-10-CM | POA: Diagnosis not present

## 2022-05-07 IMAGING — MR MR HEAD WO/W CM
14 of 20 series · 33 of 48 positions shown · IV contrast (gadavist)
Comparison: Brain MRI 06/26/2016.

CLINICAL DATA: 70-year-old female with dizziness. Headache on the
left. Blurred vision and neck pain.

EXAM:
MRI HEAD WITHOUT AND WITH CONTRAST
TECHNIQUE: Multiplanar, multiecho pulse sequences of the brain and surrounding
structures were obtained without and with intravenous contrast.
CONTRAST:  6mL GADAVIST GADOBUTROL 1 MMOL/ML IV SOLN

[Series 5: ax dwi_tracew · axial · 3.0mm · 0.65mm/px · 1 of 48 slices shown]
[im 1/48]
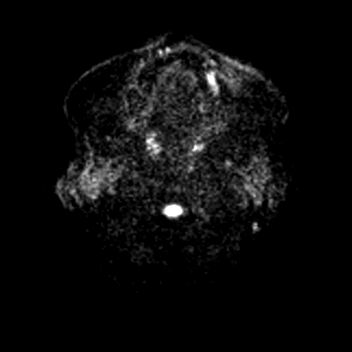

[Series 6: ax dwi_adc · axial · 3.0mm · 0.65mm/px · 1 of 48 slices shown]
[im 1/48]
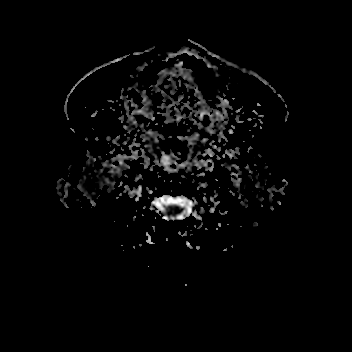

[Series 9: T1 · sagittal · 5.0mm · 0.62mm/px · 1 of 23 slices shown (1 of 4)]
[im 1/23]
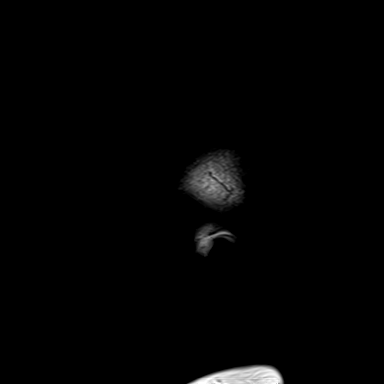

[Series 10: T2 · axial · 5.0mm · 0.53mm/px · 1 of 25 slices shown]
[im 1/25]
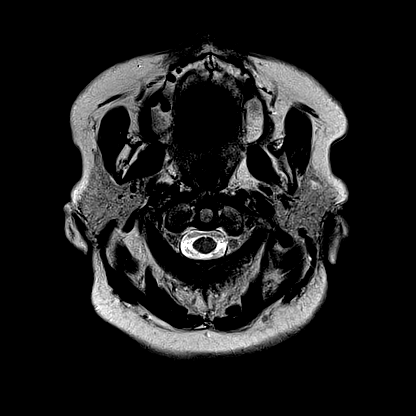

[Series 15: FLAIR · axial · 3.0mm · 0.53mm/px · z∈[-104,+58]mm · 2 of 55 slices shown]
[im 1/55]
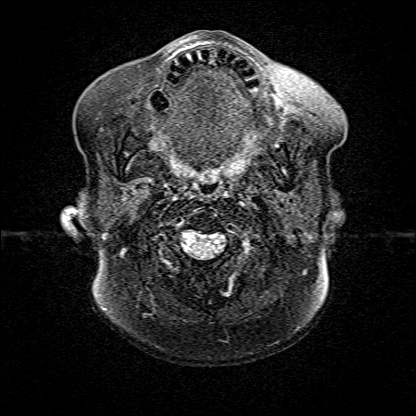
[im 55/55]
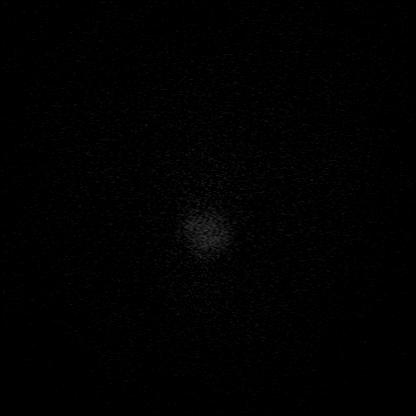

[Series 16: T1 · axial · 1.0mm · 0.98mm/px · z∈[-101,+57]mm · 7 of 160 slices shown (2 of 4)]
[im 1/160]
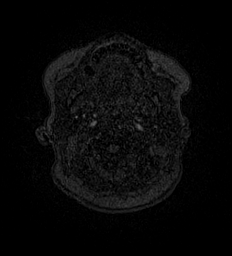
[im 27/160]
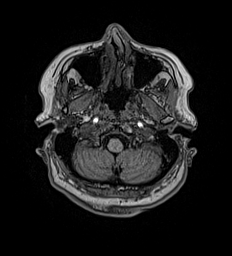
[im 54/160]
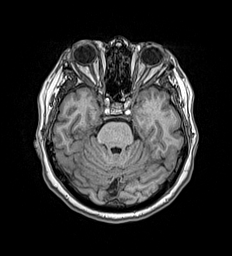
[im 80/160]
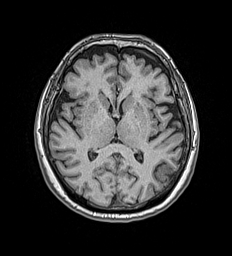
[im 107/160]
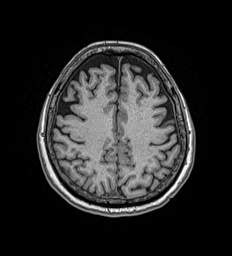
[im 133/160]
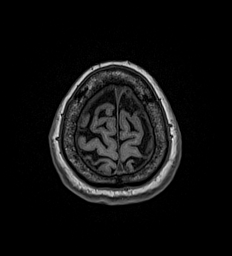
[im 160/160]
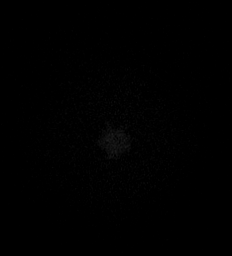

[Series 17: T2 post-contrast · coronal · 5.0mm · 0.57mm/px · 1 of 29 slices shown]
[im 1/29]
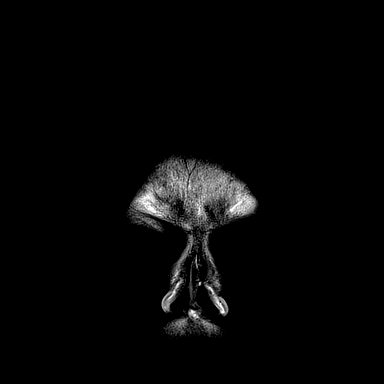

[Series 18: T1 post-contrast · axial · 1.0mm · 0.98mm/px · z∈[-101,+57]mm · 7 of 160 slices shown (1 of 5)]
[im 1/160]
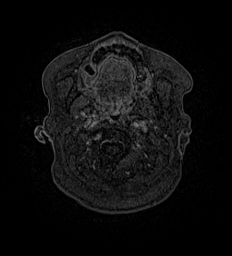
[im 27/160]
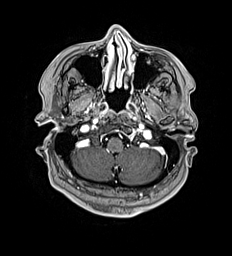
[im 54/160]
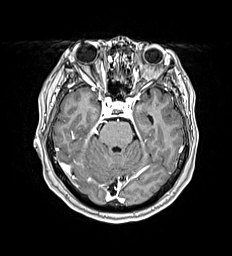
[im 80/160]
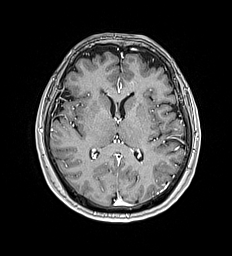
[im 107/160]
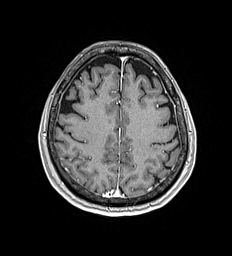
[im 133/160]
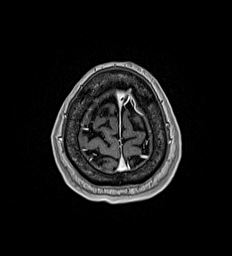
[im 160/160]
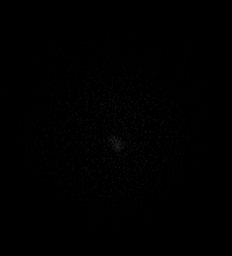

[Series 19: T1 post-contrast · coronal · 5.0mm · 0.57mm/px · 1 of 29 slices shown (2 of 5)]
[im 1/29]
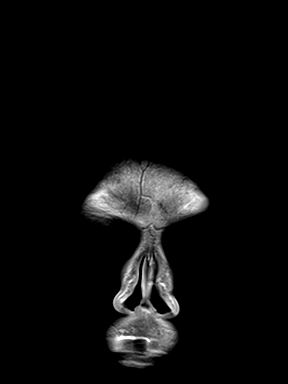

[Series 25: T1 · coronal · non-contrast · 3.0mm · 0.21mm/px · 1 of 13 slices shown (3 of 4)]
[im 1/13]
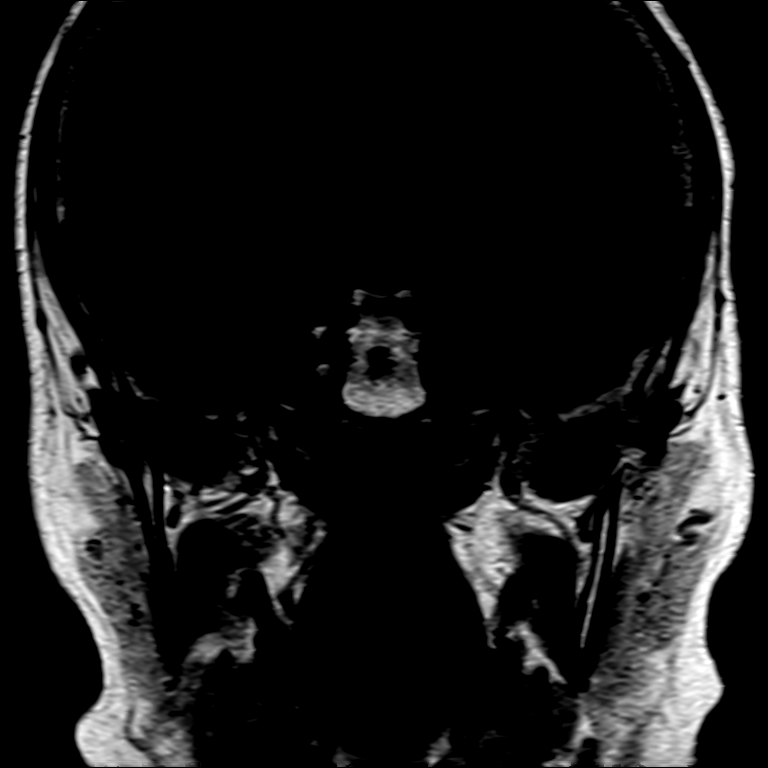

[Series 27: T1 · axial · non-contrast · 3.0mm · 0.21mm/px · 1 of 15 slices shown (4 of 4)]
[im 1/15]
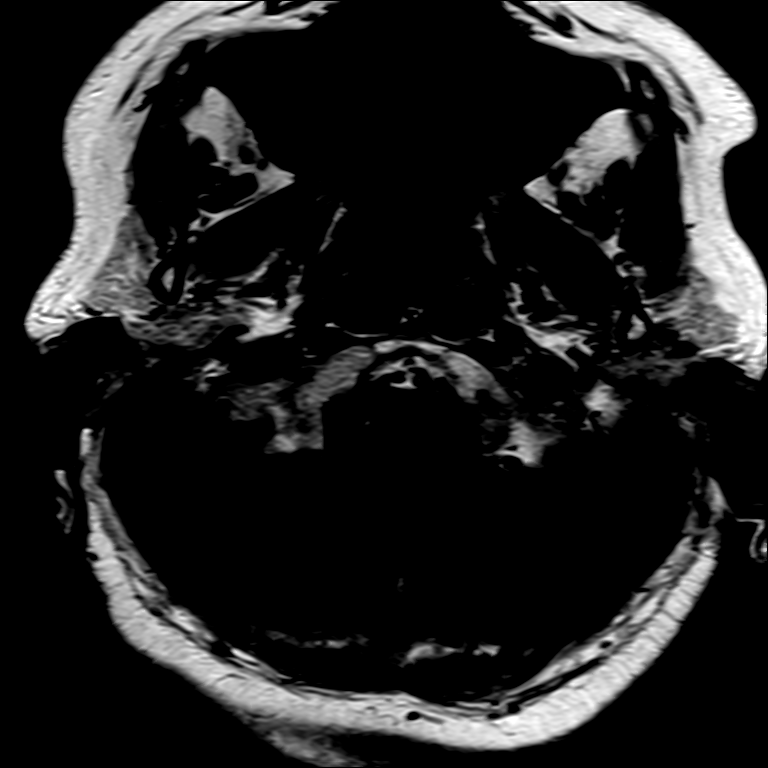

[Series 28: T1 post-contrast · axial · 3.0mm · 0.21mm/px · 1 of 15 slices shown (3 of 5)]
[im 1/15]
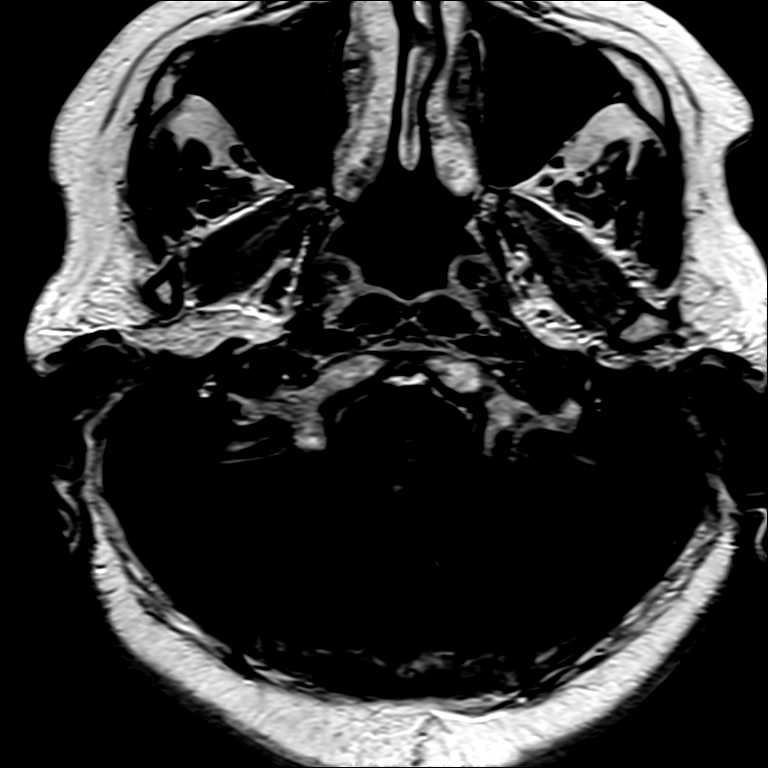

[Series 29: T1 post-contrast · coronal · 3.0mm · 0.21mm/px · 1 of 13 slices shown (4 of 5)]
[im 1/13]
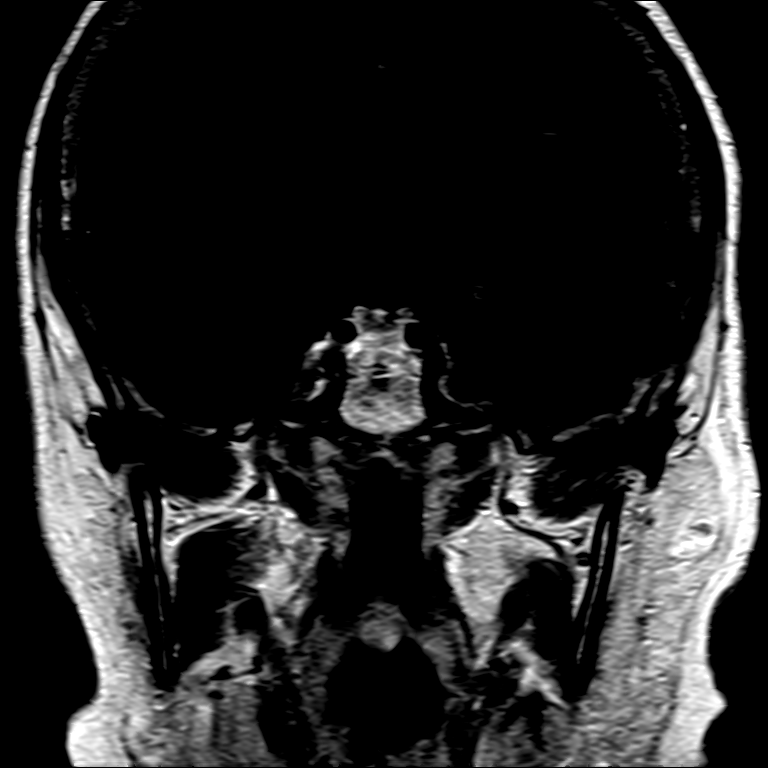

[Series 30: T1 post-contrast · axial · 1.0mm · 0.98mm/px · z∈[-104,+70]mm · 7 of 175 slices shown (5 of 5)]
[im 1/175]
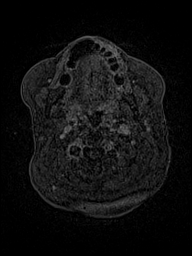
[im 30/175]
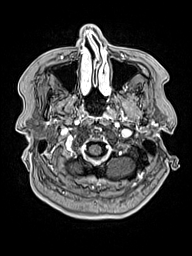
[im 59/175]
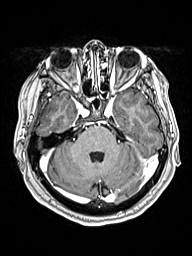
[im 88/175]
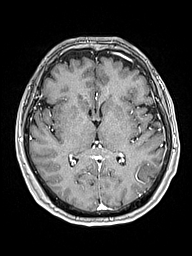
[im 117/175]
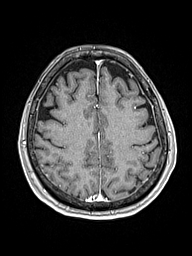
[im 146/175]
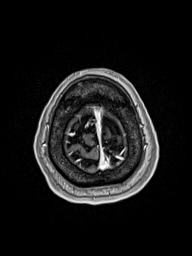
[im 175/175]
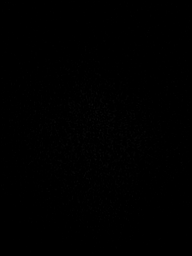

[33 of 48 positions shown; findings below may reference images not displayed]

FINDINGS: Brain: Normal cerebral volume. No restricted diffusion to suggest
acute infarction. No midline shift, mass effect, evidence of mass
lesion, ventriculomegaly, extra-axial collection or acute
intracranial hemorrhage. Cervicomedullary junction and pituitary are
within normal limits.

Gray and white matter signal is stable since 0783 and normal for
age, with only minimal nonspecific right superior frontal gyrus
subcortical white matter T2 and FLAIR hyperintensity. No cortical
encephalomalacia or chronic cerebral blood products. Deep gray
matter nuclei, brainstem, and cerebellum appear normal.

No abnormal enhancement identified.  No dural thickening.

Vascular: Major intracranial vascular flow voids are stable since
0783. Somewhat diminutive vertebrobasilar system on the basis of
fetal type PCA origins (normal variant). Major dural venous sinuses
are enhancing and appear to be patent.

Skull and upper cervical spine: Normal for age visible cervical
spine. Visualized bone marrow signal is within normal limits.

Sinuses/Orbits: Postoperative changes to the left globe since 0783.
Negative orbits. Paranasal sinuses and mastoids are stable and well
aerated.

Other: Dedicated internal auditory imaging. Normal cerebellopontine
angles. Normal bilateral cisternal and intracanalicular 7th and 8th
cranial nerve segments. Symmetric T2 signal in the bilateral cochlea
and vestibular structures. No abnormal enhancement identified.
Normal stylomastoid foramina and parotid glands.
IMPRESSION: 1. Normal internal auditory imaging.
2. Stable since 0783 and normal for age MRI appearance of the brain.

## 2022-06-17 ENCOUNTER — Ambulatory Visit (INDEPENDENT_AMBULATORY_CARE_PROVIDER_SITE_OTHER): Payer: Medicare HMO | Admitting: Dermatology

## 2022-06-17 ENCOUNTER — Encounter: Payer: Self-pay | Admitting: Dermatology

## 2022-06-17 VITALS — BP 141/77 | HR 81

## 2022-06-17 DIAGNOSIS — L738 Other specified follicular disorders: Secondary | ICD-10-CM | POA: Diagnosis not present

## 2022-06-17 DIAGNOSIS — L821 Other seborrheic keratosis: Secondary | ICD-10-CM

## 2022-06-17 DIAGNOSIS — L700 Acne vulgaris: Secondary | ICD-10-CM

## 2022-06-17 DIAGNOSIS — L720 Epidermal cyst: Secondary | ICD-10-CM | POA: Diagnosis not present

## 2022-06-17 DIAGNOSIS — L811 Chloasma: Secondary | ICD-10-CM | POA: Diagnosis not present

## 2022-06-17 MED ORDER — TRETINOIN 0.025 % EX CREA: TOPICAL_CREAM | CUTANEOUS | 3 refills | Status: DC

## 2022-06-17 NOTE — Progress Notes (Signed)
New Patient Visit  Subjective  Robin Becker is a 72 y.o. female who presents for the following: Dark spots (Around the eyes and cheeks - patient would like to discuss treatment options).  The following portions of the chart were reviewed this encounter and updated as appropriate:   Tobacco  Allergies  Meds  Problems  Med Hx  Surg Hx  Fam Hx     Review of Systems:  No other skin or systemic complaints except as noted in HPI or Assessment and Plan.  Objective  Well appearing patient in no apparent distress; mood and affect are within normal limits.  A focused examination was performed including the face. Relevant physical exam findings are noted in the Assessment and Plan.  Face Firm SQ nodule.  L infraorbital eye x 2, L cheek x 1, L temple x 1 Stuck-on, waxy, tan-brown papule or plaque --Discussed benign etiology and prognosis.   Face Open comedones.     Forehead Reticulated hyperpigmented patches.         Assessment & Plan  Epidermal inclusion cyst -open comedone Face= left upper lip Benign-appearing. Exam most consistent with an epidermal inclusion cyst. Discussed that a cyst is a benign growth that can grow over time and sometimes get irritated or inflamed. Recommend observation if it is not bothersome. Discussed option of surgical excision to remove it if it is growing, symptomatic, or other changes noted. Please call for new or changing lesions so they can be evaluated.  Seborrheic keratosis L infraorbital eye x 2, L cheek x 1, L temple x 1 Seborrheic Keratoses - Stuck-on, waxy, tan-brown papules and/or plaques  - Benign-appearing - Discussed benign etiology and prognosis. - Observe - Call for any changes  Discussed with patient that if they bother her cosmetically she can pay out of pocket $60 for the first lesion and $15 for each lesion thereafter. We treated 4 Sk's on the L cheek today. We will wait to see how they heal before treating more.    Destruction of lesion - L infraorbital eye x 2, L cheek x 1, L temple x 1 Complexity: simple   Destruction method comment:  Electrodesiccation Informed consent: discussed and consent obtained   Timeout:  patient name, date of birth, surgical site, and procedure verified Procedure prep:  Patient was prepped and draped in usual sterile fashion Prep type:  Isopropyl alcohol Anesthesia: the lesion was anesthetized in a standard fashion   Hemostasis achieved with:  electrodesiccation Outcome: patient tolerated procedure well with no complications    Acne vulgaris -comedones Face Start Tretinoin 0.025% cream QHS. Topical retinoid medications like tretinoin/Retin-A, adapalene/Differin, tazarotene/Fabior, and Epiduo/Epiduo Forte can cause dryness and irritation when first started. Only apply a pea-sized amount to the entire affected area. Avoid applying it around the eyes, edges of mouth and creases at the nose. If you experience irritation, use a good moisturizer first and/or apply the medicine less often. If you are doing well with the medicine, you can increase how often you use it until you are applying every night. Be careful with sun protection while using this medication as it can make you sensitive to the sun. This medicine should not be used by pregnant women.   tretinoin (RETIN-A) 0.025 % cream - Face Apply a thin coat to dark spots on forehead and "blackheads" on the face QHS.  Melasma Bilateral lateral forehead See photos Start Tretinoin 0.025% cream QHS. Topical retinoid medications like tretinoin/Retin-A, adapalene/Differin, tazarotene/Fabior, and Epiduo/Epiduo Forte can cause dryness  and irritation when first started. Only apply a pea-sized amount to the entire affected area. Avoid applying it around the eyes, edges of mouth and creases at the nose. If you experience irritation, use a good moisturizer first and/or apply the medicine less often. If you are doing well with the medicine,  you can increase how often you use it until you are applying every night. Be careful with sun protection while using this medication as it can make you sensitive to the sun. This medicine should not be used by pregnant women.   Sebaceous Hyperplasia - Small yellow papules with a central dell - Benign - Observe  Return if symptoms worsen or fail to improve.  Luther Redo, CMA, am acting as scribe for Sarina Ser, MD . Documentation: I have reviewed the above documentation for accuracy and completeness, and I agree with the above.  Sarina Ser, MD

## 2022-06-17 NOTE — Patient Instructions (Signed)
Due to recent changes in healthcare laws, you may see results of your pathology and/or laboratory studies on MyChart before the doctors have had a chance to review them. We understand that in some cases there may be results that are confusing or concerning to you. Please understand that not all results are received at the same time and often the doctors may need to interpret multiple results in order to provide you with the best plan of care or course of treatment. Therefore, we ask that you please give us 2 business days to thoroughly review all your results before contacting the office for clarification. Should we see a critical lab result, you will be contacted sooner.   If You Need Anything After Your Visit  If you have any questions or concerns for your doctor, please call our main line at [phone] and press option 4 to reach your doctor's medical assistant. If no one answers, please leave a voicemail as directed and we will return your call as soon as possible. Messages left after 4 pm will be answered the following business day.   You may also send us a message via MyChart. We typically respond to MyChart messages within 1-2 business days.  For prescription refills, please ask your pharmacy to contact our office. Our fax number is [phone].  If you have an urgent issue when the clinic is closed that cannot wait until the next business day, you can page your doctor at the number below.    Please note that while we do our best to be available for urgent issues outside of office hours, we are not available 24/7.   If you have an urgent issue and are unable to reach us, you may choose to seek medical care at your doctor's office, retail clinic, urgent care center, or emergency room.  If you have a medical emergency, please immediately call 911 or go to the emergency department.  Pager Numbers  - Dr. Kowalski: [phone]  - Dr. Moye: [phone]  - Dr. Stewart:  [phone]  In the event of inclement weather, please call our main line at [phone] for an update on the status of any delays or closures.  Dermatology Medication Tips: Please keep the boxes that topical medications come in in order to help keep track of the instructions about where and how to use these. Pharmacies typically print the medication instructions only on the boxes and not directly on the medication tubes.   If your medication is too expensive, please contact our office at [phone] option 4 or send us a message through MyChart.   We are unable to tell what your co-pay for medications will be in advance as this is different depending on your insurance coverage. However, we may be able to find a substitute medication at lower cost or fill out paperwork to get insurance to cover a needed medication.   If a prior authorization is required to get your medication covered by your insurance company, please allow us 1-2 business days to complete this process.  Drug prices often vary depending on where the prescription is filled and some pharmacies may offer cheaper prices.  The website www.goodrx.com contains coupons for medications through different pharmacies. The prices here do not account for what the cost may be with help from insurance (it may be cheaper with your insurance), but the website can give you the price if you did not use any insurance.  - You can print the associated coupon and take it with   your prescription to the pharmacy.  - You may also stop by our office during regular business hours and pick up a GoodRx coupon card.  - If you need your prescription sent electronically to a different pharmacy, notify our office through [place] MyChart or by phone at [phone] option 4.     Si Usted Necesita Algo Despus de Su Visita  Tambin puede enviarnos un mensaje a travs de MyChart. Por lo general respondemos a los mensajes de MyChart en el transcurso de 1 a 2  das hbiles.  Para renovar recetas, por favor pida a su farmacia que se ponga en contacto con nuestra oficina. Nuestro nmero de fax es el [phone].  Si tiene un asunto urgente cuando la clnica est cerrada y que no puede esperar hasta el siguiente da hbil, puede llamar/localizar a su doctor(a) al nmero que aparece a continuacin.   Por favor, tenga en cuenta que aunque hacemos todo lo posible para estar disponibles para asuntos urgentes fuera del horario de oficina, no estamos disponibles las 24 horas del da, los 7 das de la semana.   Si tiene un problema urgente y no puede comunicarse con nosotros, puede optar por buscar atencin mdica  en el consultorio de su doctor(a), en una clnica privada, en un centro de atencin urgente o en una sala de emergencias.  Si tiene una emergencia mdica, por favor llame inmediatamente al 911 o vaya a la sala de emergencias.  Nmeros de bper  - Dr. Kowalski: [phone]  - Dra. Moye: [phone]  - Dra. Stewart: [phone]  En caso de inclemencias del tiempo, por favor llame a nuestra lnea principal al [phone] para una actualizacin sobre el estado de cualquier retraso o cierre.  Consejos para la medicacin en dermatologa: Por favor, guarde las cajas en las que vienen los medicamentos de uso tpico para ayudarle a seguir las instrucciones sobre dnde y cmo usarlos. Las farmacias generalmente imprimen las instrucciones del medicamento slo en las cajas y no directamente en los tubos del medicamento.   Si su medicamento es muy caro, por favor, pngase en contacto con nuestra oficina llamando al [phone] y presione la opcin 4 o envenos un mensaje a travs de MyChart.   No podemos decirle cul ser su copago por los medicamentos por adelantado ya que esto es diferente dependiendo de la cobertura de su seguro. Sin embargo, es posible que podamos encontrar un medicamento sustituto a menor costo o llenar un formulario para que el  seguro cubra el medicamento que se considera necesario.   Si se requiere una autorizacin previa para que su compaa de seguros cubra su medicamento, por favor permtanos de 1 a 2 das hbiles para completar este proceso.  Los precios de los medicamentos varan con frecuencia dependiendo del lugar de dnde se surte la receta y alguna farmacias pueden ofrecer precios ms baratos.  El sitio web www.goodrx.com tiene cupones para medicamentos de diferentes farmacias. Los precios aqu no tienen en cuenta lo que podra costar con la ayuda del seguro (puede ser ms barato con su seguro), pero el sitio web puede darle el precio si no utiliz ningn seguro.  - Puede imprimir el cupn correspondiente y llevarlo con su receta a la farmacia.  - Tambin puede pasar por nuestra oficina durante el horario de atencin regular y recoger una tarjeta de cupones de GoodRx.  - Si necesita que su receta se enve electrnicamente a una farmacia diferente, informe a nuestra oficina a travs de MyChart de [place]   o por telfono llamando al [phone] y presione la opcin 4.  

## 2022-10-08 DIAGNOSIS — I2510 Atherosclerotic heart disease of native coronary artery without angina pectoris: Secondary | ICD-10-CM | POA: Diagnosis not present

## 2022-10-08 DIAGNOSIS — I10 Essential (primary) hypertension: Secondary | ICD-10-CM | POA: Diagnosis not present

## 2022-10-08 DIAGNOSIS — R002 Palpitations: Secondary | ICD-10-CM | POA: Diagnosis not present

## 2022-10-08 DIAGNOSIS — I491 Atrial premature depolarization: Secondary | ICD-10-CM | POA: Diagnosis not present

## 2023-01-26 ENCOUNTER — Ambulatory Visit (INDEPENDENT_AMBULATORY_CARE_PROVIDER_SITE_OTHER): Payer: Medicare HMO | Admitting: Family Medicine

## 2023-01-26 ENCOUNTER — Encounter: Payer: Self-pay | Admitting: Family Medicine

## 2023-01-26 VITALS — BP 126/80 | HR 73 | Temp 97.8°F | Ht 60.0 in | Wt 137.6 lb

## 2023-01-26 DIAGNOSIS — Z1231 Encounter for screening mammogram for malignant neoplasm of breast: Secondary | ICD-10-CM | POA: Diagnosis not present

## 2023-01-26 DIAGNOSIS — Z0000 Encounter for general adult medical examination without abnormal findings: Secondary | ICD-10-CM | POA: Diagnosis not present

## 2023-01-26 DIAGNOSIS — Z23 Encounter for immunization: Secondary | ICD-10-CM | POA: Diagnosis not present

## 2023-01-26 DIAGNOSIS — R7303 Prediabetes: Secondary | ICD-10-CM

## 2023-01-26 DIAGNOSIS — I10 Essential (primary) hypertension: Secondary | ICD-10-CM

## 2023-01-26 DIAGNOSIS — E785 Hyperlipidemia, unspecified: Secondary | ICD-10-CM

## 2023-01-26 LAB — COMPREHENSIVE METABOLIC PANEL
ALT: 13 U/L (ref 0–35)
AST: 17 U/L (ref 0–37)
Albumin: 4 g/dL (ref 3.5–5.2)
Alkaline Phosphatase: 86 U/L (ref 39–117)
BUN: 17 mg/dL (ref 6–23)
CO2: 29 meq/L (ref 19–32)
Calcium: 9.3 mg/dL (ref 8.4–10.5)
Chloride: 104 meq/L (ref 96–112)
Creatinine, Ser: 0.69 mg/dL (ref 0.40–1.20)
GFR: 85.74 mL/min (ref >60.00)
Glucose, Bld: 101 mg/dL — ABNORMAL HIGH (ref 70–99)
Potassium: 3.8 meq/L (ref 3.5–5.1)
Sodium: 141 meq/L (ref 135–145)
Total Bilirubin: 0.5 mg/dL (ref 0.2–1.2)
Total Protein: 6.9 g/dL (ref 6.0–8.3)

## 2023-01-26 LAB — LIPID PANEL
Cholesterol: 184 mg/dL (ref 0–200)
HDL: 63.2 mg/dL (ref >39.00)
LDL Cholesterol: 92 mg/dL (ref 0–99)
NonHDL: 121.15
Total CHOL/HDL Ratio: 3
Triglycerides: 148 mg/dL (ref 0.0–149.0)
VLDL: 29.6 mg/dL (ref 0.0–40.0)

## 2023-01-26 LAB — HEMOGLOBIN A1C: Hgb A1c MFr Bld: 5.9 % (ref 4.6–6.5)

## 2023-01-26 MED ORDER — ESOMEPRAZOLE MAGNESIUM 20 MG PO CPDR
20.0000 mg | DELAYED_RELEASE_CAPSULE | Freq: Every day | ORAL | 0 refills | Status: DC | PRN
Start: 2023-01-26 — End: 2023-02-19

## 2023-01-26 MED ORDER — OLMESARTAN MEDOXOMIL 20 MG PO TABS: 10.0000 mg | ORAL_TABLET | Freq: Every day | ORAL | 1 refills | Status: AC

## 2023-01-26 NOTE — Assessment & Plan Note (Signed)
Physical exam completed.  Encouraged continued healthy diet and activity level.  Patient will call to schedule her mammogram.  Colonoscopy is up-to-date.  She was given flu vaccine and pneumonia vaccines today.  I encouraged her to get the Shingrix vaccine at the pharmacy.  She declines further COVID vaccinations.  I encouraged her to quit smoking.  Lab work as outlined.

## 2023-01-26 NOTE — Patient Instructions (Signed)
Nice to see you. Please call [phone] to schedule your mammogram.  We will contact you with your labs. Please consider quitting smoking.

## 2023-01-26 NOTE — Progress Notes (Signed)
Marikay Alar, MD Phone: [phone]  Robin Becker is a 72 y.o. female who presents today for CPE.  Diet: No fried foods, no sweets, not many carbohydrates, does eat fruits and vegetables, no soda or sweet tea Exercise: Active at home caring for her husband Colonoscopy: Up-to-date Mammogram: Due Family history-  Colon cancer: no  Breast cancer: no  Ovarian cancer: no Vaccines-   Flu: due  Tetanus: UTD- reports she had this 4-5 years ago  Shingles: due  COVID19: declines, reports she's had 54 vaccines  Pneumonia: due Hep C Screening: UTD Tobacco use: Maximum 6 cigarettes a day, reports smoking 1/4 pack/day or less since age 47 Alcohol use: 1 to 2/week Illicit Drug use: No Dentist: No Ophthalmology: Yes Patient does report some stress related to caring for her husband.   Active Ambulatory Problems    Diagnosis Date Noted   New onset of headaches after age 9    GERD (gastroesophageal reflux disease)    Seasonal allergies    HLD (hyperlipidemia)    Nicotine dependence, cigarettes, uncomplicated    Hypopigmented skin lesion 12/02/2014   Routine general medical examination at a health care facility 05/22/2015   Essential hypertension 12/18/2015   OA (osteoarthritis) 12/18/2015   Prediabetes 12/18/2015   Allergic dermatitis 09/29/2016   Depression, major, single episode, mild (HCC) 10/13/2017   Family history of colon cancer 09/13/2020   Constipation 06/12/2021   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   Coronary artery calcification seen on CT scan 09/09/2021   Heart palpitations 09/09/2021   Hx of adenomatous colonic polyps 09/17/2021   Premature atrial contractions 09/09/2021   Resolved Ambulatory Problems    Diagnosis Date Noted   History of pneumonia    Postmenopausal 09/16/2011   Special screening for malignant neoplasms, colon 12/02/2014   Abscess of vulva 06/22/2015   Acute bronchitis 06/04/2016   Vision disorder 09/29/2016   Sinusitis  10/13/2017   Past Medical History:  Diagnosis Date   Allergy    Chicken pox    Depression    Glaucoma    History of chicken pox    Hypertension    Smoking     Family History  Problem Relation Age of Onset   Heart disease Mother    Stroke Mother    Cancer Sister 47       colon and otherwise   Stroke Maternal Aunt    Coronary artery disease Cousin    Diabetes Neg Hx    Breast cancer Neg Hx     Social History   Socioeconomic History   Marital status: Married    Spouse name: Not on file   Number of children: Not on file   Years of education: Not on file   Highest education level: Not on file  Occupational History   Not on file  Tobacco Use   Smoking status: Every Day    Current packs/day: 0.25    Types: Cigarettes   Smokeless tobacco: Never  Vaping Use   Vaping status: Never Used  Substance and Sexual Activity   Alcohol use: Yes    Alcohol/week: 0.0 standard drinks of alcohol    Comment: Regular on weekends   Drug use: No   Sexual activity: Not on file  Other Topics Concern   Not on file  Social History Narrative   Caffeine: 2 cups coffee/day   Lives with husband and son and step son   Occupation: homemaker   Edu:   Activity: wants to restart gym.  No  regular activity   Diet: good water, vegetables daily   From [place]    Social Determinants of Health   Financial Resource Strain: Low Risk  (08/11/2021)   Overall Financial Resource Strain (CARDIA)    Difficulty of Paying Living Expenses: Not hard at all  Food Insecurity: No Food Insecurity (08/11/2021)   Hunger Vital Sign    Worried About Running Out of Food in the Last Year: Never true    Ran Out of Food in the Last Year: Never true  Transportation Needs: No Transportation Needs (08/11/2021)   PRAPARE - Administrator, Civil Service (Medical): No    Lack of Transportation (Non-Medical): No  Physical Activity: Insufficiently Active (08/11/2021)   Exercise Vital Sign    Days of Exercise per  Week: 7 days    Minutes of Exercise per Session: 10 min  Stress: No Stress Concern Present (08/11/2021)   [employer] of Occupational Health - Occupational Stress Questionnaire    Feeling of Stress : Not at all  Social Connections: Unknown (08/11/2021)   Social Connection and Isolation Panel [NHANES]    Frequency of Communication with Friends and Family: More than three times a week    Frequency of Social Gatherings with Friends and Family: Three times a week    Attends Religious Services: Not on file    Active Member of Clubs or Organizations: Not on file    Attends Club or Organization Meetings: Not on file    Marital Status: Married  Intimate Partner Violence: Not At Risk (08/11/2021)   Humiliation, Afraid, Rape, and Kick questionnaire    Fear of Current or Ex-Partner: No    Emotionally Abused: No    Physically Abused: No    Sexually Abused: No    ROS  General:  Negative for nexplained weight loss, fever Skin: Negative for new or changing mole, sore that won't heal HEENT: Negative for trouble hearing, trouble seeing, ringing in ears, mouth sores, hoarseness, change in voice, dysphagia. CV:  Negative for chest pain, dyspnea, edema, palpitations Resp: Negative for cough, dyspnea, hemoptysis GI: Negative for nausea, vomiting, diarrhea, constipation, abdominal pain, melena, hematochezia. GU: Negative for dysuria, incontinence, urinary hesitance, hematuria, vaginal or penile discharge, polyuria, sexual difficulty, lumps in testicle or breasts MSK: Negative for muscle cramps or aches, joint pain or swelling Neuro: Negative for headaches, weakness, numbness, dizziness, passing out/fainting Psych: Positive for anxiety, negative for depression, memory problems  Objective  Physical Exam Vitals:   01/26/23 0819  BP: 126/80  Pulse: 73  Temp: 97.8 F (36.6 C)  SpO2: 97%    BP Readings from Last 3 Encounters:  01/26/23 126/80  06/17/22 (!) 141/77  12/24/21 123/64   Wt Readings  from Last 3 Encounters:  01/26/23 137 lb 9.6 oz (62.4 kg)  12/24/21 140 lb (63.5 kg)  09/25/21 144 lb 9.6 oz (65.6 kg)    Physical Exam Constitutional:      General: She is not in acute distress.    Appearance: She is not diaphoretic.  HENT:     Head: Normocephalic and atraumatic.  Cardiovascular:     Rate and Rhythm: Normal rate and regular rhythm.     Heart sounds: Normal heart sounds.  Pulmonary:     Effort: Pulmonary effort is normal.     Breath sounds: Normal breath sounds.  Abdominal:     General: Bowel sounds are normal. There is no distension.     Palpations: Abdomen is soft.     Tenderness: There  is no abdominal tenderness.  Musculoskeletal:     Right lower leg: No edema.     Left lower leg: No edema.  Lymphadenopathy:     Cervical: No cervical adenopathy.  Skin:    General: Skin is warm and dry.  Neurological:     Mental Status: She is alert.      Assessment/Plan:   Routine general medical examination at a health care facility Assessment & Plan: Physical exam completed.  Encouraged continued healthy diet and activity level.  Patient will call to schedule her mammogram.  Colonoscopy is up-to-date.  She was given flu vaccine and pneumonia vaccines today.  I encouraged her to get the Shingrix vaccine at the pharmacy.  She declines further COVID vaccinations.  I encouraged her to quit smoking.  Lab work as outlined.   Essential hypertension -     Olmesartan Medoxomil; Take 0.5 tablets (10 mg total) by mouth daily. D/c 20 mg dose  Dispense: 90 tablet; Refill: 1  Hyperlipidemia, unspecified hyperlipidemia type -     Comprehensive metabolic panel -     Lipid panel  Prediabetes -     Hemoglobin A1c  Encounter for screening mammogram for malignant neoplasm of breast -     3D Screening Mammogram, Left and Right; Future  Encounter for administration of vaccine -     Flu Vaccine Trivalent High Dose (Fluad) -     Pneumococcal conjugate vaccine 20-valent  Other  orders -     Esomeprazole Magnesium; Take 1 capsule (20 mg total) by mouth daily as needed (Heartburn).  Dispense: 30 capsule; Refill: 0    Return in about 6 months (around 07/26/2023) for Hypertension.   Marikay Alar, MD [facility]

## 2023-02-01 ENCOUNTER — Ambulatory Visit: Payer: Medicare HMO | Admitting: Family Medicine

## 2023-02-18 ENCOUNTER — Other Ambulatory Visit: Payer: Self-pay | Admitting: Family Medicine

## 2023-05-21 ENCOUNTER — Ambulatory Visit (INDEPENDENT_AMBULATORY_CARE_PROVIDER_SITE_OTHER): Payer: Medicare HMO | Admitting: *Deleted

## 2023-05-21 VITALS — Ht 60.0 in | Wt 137.0 lb

## 2023-05-21 DIAGNOSIS — Z0000 Encounter for general adult medical examination without abnormal findings: Secondary | ICD-10-CM | POA: Diagnosis not present

## 2023-05-21 NOTE — Progress Notes (Signed)
 Subjective:   Robin Becker is a 73 y.o. female who presents for Medicare Annual (Subsequent) preventive examination.  Visit Complete: Virtual I connected with  Robin Becker on 05/21/23 by a audio enabled telemedicine application and verified that I am speaking with the correct person using two identifiers. This patient declined Interactive audio and field seismologist. Therefore the visit was completed with audio only.   Patient Location: Home  Provider Location: Home Office  I discussed the limitations of evaluation and management by telemedicine. The patient expressed understanding and agreed to proceed.  Vital Signs: Because this visit was a virtual/telehealth visit, some criteria may be missing or patient reported. Any vitals not documented were not able to be obtained and vitals that have been documented are patient reported.   Cardiac Risk Factors include: advanced age (>22men, >87 women);dyslipidemia;hypertension;smoking/ tobacco exposure     Objective:    Today's Vitals   05/21/23 0900  Weight: 137 lb (62.1 kg)  Height: 5' (1.524 m)   Body mass index is 26.76 kg/m.     05/21/2023    9:15 AM 12/24/2021    1:36 PM 09/12/2021   11:58 AM 09/06/2021    4:53 PM 08/11/2021   12:29 PM 08/08/2020   10:41 AM 08/08/2019   10:45 AM  Advanced Directives  Does Patient Have a Medical Advance Directive? No No No No No No No  Would patient like information on creating a medical advance directive? No - Patient declined  No - Patient declined  No - Patient declined No - Patient declined No - Patient declined    Current Medications (verified) Outpatient Encounter Medications as of 05/21/2023  Medication Sig   Blood Glucose Monitoring Suppl (ACCU-CHEK AVIVA PLUS) w/Device KIT 1 Device by Does not apply route as needed.   esomeprazole  (NEXIUM ) 20 MG capsule TAKE 1 CAPSULE BY MOUTH EVERY DAY AS NEEDED FOR HEARTBURN   fexofenadine  (ALLEGRA  ALLERGY) 180 MG tablet Take 1 tablet  (180 mg total) by mouth at bedtime. Have patient review this medication with PCP soon as available.  It appears she has been taking it chronically at high doses 5 times normal.  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   glucose blood (ACCU-CHEK AVIVA) test strip Used to check blood sugars once a day.   ibuprofen  (ADVIL ) 800 MG tablet Take 1 tablet (800 mg total) by mouth every 8 (eight) hours as needed.   Lancets (ACCU-CHEK SOFT TOUCH) lancets Use to check blood sugars once a day.   olmesartan  (BENICAR ) 20 MG tablet Take 0.5 tablets (10 mg total) by mouth daily. D/c 20 mg dose   tretinoin  (RETIN-A ) 0.025 % cream Apply a thin coat to dark spots on forehead and blackheads on the face QHS.   No facility-administered encounter medications on file as of 05/21/2023.    Allergies (verified) Lipitor [atorvastatin  calcium ]   History: Past Medical History:  Diagnosis Date   Allergy    Seasonal   Chicken pox    Depression    GERD (gastroesophageal reflux disease)    Glaucoma    s/p surgery   History of chicken pox    History of pneumonia 2013   [facility]   Hypertension    Prediabetes 2011   h/o A1c 6.5%   Seasonal allergies    Smoking    Past Surgical History:  Procedure Laterality Date   CATARACT EXTRACTION W/PHACO Right 08/27/2015   Procedure: CATARACT EXTRACTION PHACO AND INTRAOCULAR LENS PLACEMENT (IOC);  Surgeon: Elsie  Porfilio, MD;  Location: [facility] ORS;  Service: Ophthalmology;  Laterality: Right;  US  00:53 AP% 20.1 CDE 10.80 fluid pack lot # [id number]   CESAREAN SECTION  05/12/1975   CHOLECYSTECTOMY  09/24/2021   COLONOSCOPY     COLONOSCOPY N/A 12/24/2021   Procedure: COLONOSCOPY;  Surgeon: Toledo, Ladell POUR, MD;  Location: [facility] ENDOSCOPY;  Service: Gastroenterology;  Laterality: N/A;   ESOPHAGOGASTRODUODENOSCOPY N/A 12/24/2021   Procedure: ESOPHAGOGASTRODUODENOSCOPY (EGD);  Surgeon: Toledo, Ladell POUR, MD;  Location: [facility] ENDOSCOPY;  Service: Gastroenterology;   Laterality: N/A;  PATIENT STATES THAT SHE DOES WANT UPPER ENDOSCOPY   Family History  Problem Relation Age of Onset   Heart disease Mother    Stroke Mother    Cancer Sister 69       colon and otherwise   Stroke Maternal Aunt    Coronary artery disease Cousin    Diabetes Neg Hx    Breast cancer Neg Hx    Social History   Socioeconomic History   Marital status: Married    Spouse name: Not on file   Number of children: Not on file   Years of education: Not on file   Highest education level: Not on file  Occupational History   Not on file  Tobacco Use   Smoking status: Every Day    Current packs/day: 0.25    Types: Cigarettes   Smokeless tobacco: Never  Vaping Use   Vaping status: Never Used  Substance and Sexual Activity   Alcohol  use: Yes    Alcohol /week: 0.0 standard drinks of alcohol     Comment: Regular on weekends   Drug use: No   Sexual activity: Not on file  Other Topics Concern   Not on file  Social History Narrative   Caffeine: 2 cups coffee/day   Lives with husband and son and step son   Occupation: homemaker   Edu:   Activity: wants to restart gym.  No regular activity   Diet: good water, vegetables daily   From [place]    Social Drivers of Health   Financial Resource Strain: Low Risk  (05/21/2023)   Overall Financial Resource Strain (CARDIA)    Difficulty of Paying Living Expenses: Not hard at all  Food Insecurity: No Food Insecurity (05/21/2023)   Hunger Vital Sign    Worried About Running Out of Food in the Last Year: Never true    Ran Out of Food in the Last Year: Never true  Transportation Needs: No Transportation Needs (05/21/2023)   PRAPARE - Administrator, Civil Service (Medical): No    Lack of Transportation (Non-Medical): No  Physical Activity: Inactive (05/21/2023)   Exercise Vital Sign    Days of Exercise per Week: 0 days    Minutes of Exercise per Session: 0 min  Stress: No Stress Concern Present (05/21/2023)   [employer] - Occupational Stress Questionnaire    Feeling of Stress : Not at all  Social Connections: Socially Isolated (05/21/2023)   Social Connection and Isolation Panel [NHANES]    Frequency of Communication with Friends and Family: Once a week    Frequency of Social Gatherings with Friends and Family: Never    Attends Religious Services: Never    Database Administrator or Organizations: No    Attends Engineer, Structural: Never    Marital Status: Married    Tobacco Counseling Ready to quit: No Counseling given: Not Answered   Clinical Intake:  Pre-visit preparation completed: Yes  Pain : No/denies pain     BMI - recorded: 26.76 Nutritional Status: BMI 25 -29 Overweight Nutritional Risks: None Diabetes: No  How often do you need to have someone help you when you read instructions, pamphlets, or other written materials from your doctor or pharmacy?: 1 - Never  Interpreter Needed?: No  Information entered by :: R. Leala Bryand LPN   Activities of Daily Living    05/21/2023    9:03 AM  In your present state of health, do you have any difficulty performing the following activities:  Hearing? 0  Vision? 0  Comment glasses  Difficulty concentrating or making decisions? 0  Walking or climbing stairs? 0  Dressing or bathing? 0  Doing errands, shopping? 0  Preparing Food and eating ? N  Using the Toilet? N  In the past six months, have you accidently leaked urine? N  Do you have problems with loss of bowel control? N  Managing your Medications? N  Managing your Finances? N  Housekeeping or managing your Housekeeping? N    Patient Care Team: Maribeth Camellia MATSU, MD as PCP - General (Family Medicine) Toledo, Teodoro K, MD as Consulting Physician (Gastroenterology) Hester Alm BROCKS, MD (Dermatology)  Indicate any recent Medical Services you may have received from other than [facility] providers in the past year (date may be approximate).      Assessment:   This is a routine wellness examination for Robin Becker.  Hearing/Vision screen Hearing Screening - Comments:: No issues Vision Screening - Comments:: glasses   Goals Addressed             This Visit's Progress    Patient Stated       Continue to eat healthy       Depression Screen    05/21/2023    9:09 AM 01/26/2023    8:21 AM 08/11/2021   12:17 PM 08/08/2020   10:44 AM 08/08/2019   10:44 AM 10/06/2018   11:00 AM 08/18/2017    9:26 AM  PHQ 2/9 Scores  PHQ - 2 Score 1 0 0 1 0 0 0  PHQ- 9 Score 2 0    0     Fall Risk    05/21/2023    9:06 AM 01/26/2023    8:21 AM 09/25/2021    1:17 PM 08/11/2021   12:31 PM 08/08/2020   10:42 AM  Fall Risk   Falls in the past year? 0 0 0 0 0  Number falls in past yr: 0 0  0 0  Injury with Fall? 0 0   0  Risk for fall due to : No Fall Risks No Fall Risks     Follow up Falls prevention discussed;Falls evaluation completed Falls evaluation completed  Falls evaluation completed Falls evaluation completed    MEDICARE RISK AT HOME: Medicare Risk at Home Any stairs in or around the home?: No If so, are there any without handrails?: No Home free of loose throw rugs in walkways, pet beds, electrical cords, etc?: Yes Adequate lighting in your home to reduce risk of falls?: Yes Life alert?: No Use of a cane, walker or w/c?: No Grab bars in the bathroom?: No Shower chair or bench in shower?: Yes Elevated toilet seat or a handicapped toilet?: No  Cognitive Function:        05/21/2023    9:15 AM 08/11/2021   12:31 PM 08/08/2020   10:46 AM 08/08/2019   10:58 AM  6CIT Screen  What Year? 0 points 0 points  0 points 0 points  What month? 0 points 0 points 0 points 0 points  What time? 0 points 0 points 0 points 0 points  Count back from 20 0 points 0 points 0 points 0 points  Months in reverse 0 points 0 points 0 points 0 points  Repeat phrase 2 points 0 points 0 points 0 points  Total Score 2 points 0 points 0 points 0 points     Immunizations Immunization History  Administered Date(s) Administered   DTaP 06/13/2007   Fluad Quad(high Dose 65+) 06/12/2021   Fluad Trivalent(High Dose 65+) 01/26/2023   Influenza Split 04/30/2022   Influenza, High Dose Seasonal PF 01/28/2018   Influenza-Unspecified 03/18/2015, 03/11/2016, 05/11/2020   PFIZER(Purple Top)SARS-COV-2 Vaccination 08/10/2019, 09/05/2019   PNEUMOCOCCAL CONJUGATE-20 01/26/2023   Pneumococcal Polysaccharide-23 09/16/2011   RSV,unspecified 04/30/2022   Unspecified SARS-COV-2 Vaccination 04/30/2022    TDAP status: Due, Education has been provided regarding the importance of this vaccine. Advised may receive this vaccine at local pharmacy or Health Dept. Aware to provide a copy of the vaccination record if obtained from local pharmacy or Health Dept. Verbalized acceptance and understanding.  Flu Vaccine status: Up to date  Pneumococcal vaccine status: Up to date  Covid-19 vaccine status: Information provided on how to obtain vaccines.   Qualifies for Shingles Vaccine? Yes   Zostavax completed No   Shingrix Completed?: No.    Education has been provided regarding the importance of this vaccine. Patient has been advised to call insurance company to determine out of pocket expense if they have not yet received this vaccine. Advised may also receive vaccine at local pharmacy or Health Dept. Verbalized acceptance and understanding.  Screening Tests Health Maintenance  Topic Date Due   Zoster Vaccines- Shingrix (1 of 2) Never done   DTaP/Tdap/Td (2 - Tdap) 06/12/2017   Medicare Annual Wellness (AWV)  08/12/2022   COVID-19 Vaccine (4 - 2024-25 season) 01/10/2023   MAMMOGRAM  07/19/2023   Colonoscopy  12/25/2031   Pneumonia Vaccine 39+ Years old  Completed   INFLUENZA VACCINE  Completed   DEXA SCAN  Completed   Hepatitis C Screening  Completed   HPV VACCINES  Aged Out    Health Maintenance  Health Maintenance Due  Topic Date Due   Zoster  Vaccines- Shingrix (1 of 2) Never done   DTaP/Tdap/Td (2 - Tdap) 06/12/2017   Medicare Annual Wellness (AWV)  08/12/2022   COVID-19 Vaccine (4 - 2024-25 season) 01/10/2023    Colorectal cancer screening: Type of screening: Colonoscopy. Completed 12/2021. Repeat every 10 years  Mammogram status: Completed 07/2021. Repeat every year  Bone Density status: Completed 06/2015. Results reflect: Bone density results: OSTEOPOROSIS. Repeat every 2 years. Patient declines unable to leave husband  Lung Cancer Screening: (Low Dose CT Chest recommended if Age 24-80 years, 20 pack-year currently smoking OR have quit w/in 15years.) does qualify. Patient declines     Additional Screening:  Hepatitis C Screening: does qualify; Completed 08/2017  Vision Screening: Recommended annual ophthalmology exams for early detection of glaucoma and other disorders of the eye. Is the patient up to date with their annual eye exam?  Yes  Who is the provider or what is the name of the office in which the patient attends annual eye exams? [facility] doctor If pt is not established with a provider, would they like to be referred to a provider to establish care? No .   Dental Screening: Recommended annual dental exams for proper oral hygiene  Community Resource Referral / Chronic Care Management: CRR required this visit?  No   CCM required this visit?  No     Plan:     I have personally reviewed and noted the following in the patient's chart:   Medical and social history Use of alcohol , tobacco or illicit drugs  Current medications and supplements including opioid prescriptions. Patient is not currently taking opioid prescriptions. Functional ability and status Nutritional status Physical activity Advanced directives List of other physicians Hospitalizations, surgeries, and ER visits in previous 12 months Vitals Screenings to include cognitive, depression, and falls Referrals and appointments  In  addition, I have reviewed and discussed with patient certain preventive protocols, quality metrics, and best practice recommendations. A written personalized care plan for preventive services as well as general preventive health recommendations were provided to patient.     Angeline Fredericks, LPN   8/89/7974   After Visit Summary: (Pick Up) Due to this being a telephonic visit, with patients personalized plan was offered to patient and patient has requested to Pick up at office.  Nurse Notes: None

## 2023-05-21 NOTE — Patient Instructions (Signed)
 [name] , Thank you for taking time to come for your Medicare Wellness Visit. I appreciate your ongoing commitment to your health goals. Please review the following plan we discussed and let me know if I can assist you in the future.   Referrals/Orders/Follow-Ups/Clinician Recommendations: Consider updating your vaccines.  This is a list of the screening recommended for you and due dates:  Health Maintenance  Topic Date Due   Zoster (Shingles) Vaccine (1 of 2) Never done   DTaP/Tdap/Td vaccine (2 - Tdap) 06/12/2017   COVID-19 Vaccine (4 - 2024-25 season) 01/10/2023   Mammogram  07/19/2023   Medicare Annual Wellness Visit  05/20/2024   Colon Cancer Screening  12/25/2031   Pneumonia Vaccine  Completed   Flu Shot  Completed   DEXA scan (bone density measurement)  Completed   Hepatitis C Screening  Completed   HPV Vaccine  Aged Out    Advanced directives: (Declined) Advance directive discussed with you today. Even though you declined this today, please call our office should you change your mind, and we can give you the proper paperwork for you to fill out.  Next Medicare Annual Wellness Visit scheduled for next year: Yes 05/22/24 @ 10:10

## 2023-05-23 ENCOUNTER — Encounter: Payer: Self-pay | Admitting: Emergency Medicine

## 2023-05-23 ENCOUNTER — Ambulatory Visit
Admission: EM | Admit: 2023-05-23 | Discharge: 2023-05-23 | Disposition: A | Discharge status: Home / Self Care | Payer: Medicare PPO | Attending: Emergency Medicine | Admitting: Emergency Medicine

## 2023-05-23 DIAGNOSIS — J069 Acute upper respiratory infection, unspecified: Secondary | ICD-10-CM | POA: Diagnosis not present

## 2023-05-23 LAB — POC COVID19/FLU A&B COMBO
Covid Antigen, POC: NEGATIVE
Influenza A Antigen, POC: NEGATIVE
Influenza B Antigen, POC: NEGATIVE

## 2023-05-23 MED ORDER — ALBUTEROL SULFATE HFA 108 (90 BASE) MCG/ACT IN AERS
1.0000 | INHALATION_SPRAY | Freq: Four times a day (QID) | RESPIRATORY_TRACT | 0 refills | Status: AC | PRN
Start: 2023-05-23 — End: ?

## 2023-05-23 MED ORDER — BENZONATATE 100 MG PO CAPS: 100.0000 mg | ORAL_CAPSULE | Freq: Three times a day (TID) | ORAL | 0 refills | Status: DC | PRN

## 2023-05-23 NOTE — Discharge Instructions (Addendum)
 The COVID and flu tests are negative.   Use the albuterol inhaler and take the Tessalon as directed.    Follow-up with your primary care provider if your symptoms are not improving.

## 2023-05-23 NOTE — ED Provider Notes (Signed)
 [facility]    CSN: [id number] Arrival date & time: 05/23/23  1149      History   Chief Complaint Chief Complaint  Patient presents with   Fever   Cough   Nasal Congestion    HPI Robin Becker is a 73 y.o. female.  Patient presents with 3-day history of fever, congestion, cough.  She has been taking ibuprofen .  She also used an albuterol  nebulizer (expired in 2023) that was prescribed for her husband.  She denies chest pain or shortness of breath.  Her medical history includes hypertension, hyperlipidemia, prediabetes.   The history is provided by the patient and medical records. No language interpreter was used (Patient declined interpreter).    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   Coronary artery calcification seen on CT scan 09/09/2021   Heart palpitations 09/09/2021   Premature atrial contractions 09/09/2021   Constipation 06/12/2021   Family history of colon cancer 09/13/2020   Depression, major, single episode, mild (HCC) 10/13/2017   Allergic dermatitis 09/29/2016   Essential hypertension 12/18/2015   OA (osteoarthritis) 12/18/2015   Prediabetes 12/18/2015   Routine general medical examination at a health care facility 05/22/2015   Hypopigmented skin lesion 12/02/2014   New onset of headaches after age 85    GERD (gastroesophageal reflux disease)    Seasonal allergies    HLD (hyperlipidemia)    Nicotine dependence, cigarettes, uncomplicated     Past Surgical History:  Procedure Laterality Date   CATARACT EXTRACTION  W/PHACO Right 08/27/2015   Procedure: CATARACT EXTRACTION PHACO AND INTRAOCULAR LENS PLACEMENT (IOC);  Surgeon: Elsie Carmine, MD;  Location: [facility] ORS;  Service: Ophthalmology;  Laterality: Right;  US  00:53 AP% 20.1 CDE 10.80 fluid pack lot # [id number]   CESAREAN SECTION  05/12/1975   CHOLECYSTECTOMY  09/24/2021   COLONOSCOPY     COLONOSCOPY N/A 12/24/2021   Procedure: COLONOSCOPY;  Surgeon: Toledo, Ladell POUR, MD;  Location: [facility] ENDOSCOPY;  Service: Gastroenterology;  Laterality: N/A;   ESOPHAGOGASTRODUODENOSCOPY N/A 12/24/2021   Procedure: ESOPHAGOGASTRODUODENOSCOPY (EGD);  Surgeon: Toledo, Ladell POUR, MD;  Location: [facility] ENDOSCOPY;  Service: Gastroenterology;  Laterality: N/A;  PATIENT STATES THAT SHE DOES WANT UPPER ENDOSCOPY    OB History   No obstetric history on file.      Home Medications    Prior to Admission medications   Medication Sig Start Date End Date Taking? Authorizing Provider  albuterol  (VENTOLIN  HFA) 108 (90 Base) MCG/ACT inhaler Inhale 1-2 puffs into the lungs every 6 (six) hours as needed. 05/23/23  Yes Corlis Burnard DEL, NP  benzonatate  (TESSALON ) 100 MG capsule Take 1 capsule (100 mg total) by mouth 3 (three) times daily as needed for cough. 05/23/23  Yes Corlis Burnard DEL, NP  Blood Glucose Monitoring Suppl (ACCU-CHEK AVIVA PLUS) w/Device KIT 1 Device by Does not apply route as needed. 06/02/18   Maribeth Camellia MATSU, MD  esomeprazole  (NEXIUM ) 20 MG capsule TAKE 1 CAPSULE  BY MOUTH EVERY DAY AS NEEDED FOR HEARTBURN 02/19/23   Maribeth Camellia MATSU, MD  fexofenadine  (ALLEGRA  ALLERGY) 180 MG tablet Take 1 tablet (180 mg total) by mouth at bedtime. Have patient review this medication with PCP soon as available.  It appears she has been taking it chronically at high doses 5 times normal.  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 09/12/21   Lane Shope, MD  glucose blood (ACCU-CHEK AVIVA) test strip Used to check blood sugars once a day. 06/02/18   Maribeth Camellia MATSU, MD  ibuprofen  (ADVIL ) 800 MG tablet Take 1 tablet (800 mg total) by mouth every 8 (eight) hours as needed. 09/12/21   Lane Shope, MD  Lancets (ACCU-CHEK SOFT TOUCH) lancets Use to check blood sugars once a day. 06/02/18   Maribeth Camellia MATSU, MD  olmesartan  (BENICAR ) 20 MG tablet Take 0.5 tablets (10 mg total) by mouth daily. D/c 20 mg dose 01/26/23   Maribeth Camellia MATSU, MD  tretinoin  (RETIN-A ) 0.025 % cream Apply a thin coat to dark spots on forehead and blackheads on the face QHS. 06/17/22   Hester Alm BROCKS, MD    Family History Family History  Problem Relation Age of Onset   Heart disease Mother    Stroke Mother    Cancer Sister 6       colon and otherwise   Stroke Maternal Aunt    Coronary artery disease Cousin    Diabetes Neg Hx    Breast cancer Neg Hx     Social History Social History   Tobacco Use   Smoking status: Every Day    Current packs/day: 0.25    Types: Cigarettes   Smokeless tobacco: Never  Vaping Use   Vaping status: Never Used  Substance Use Topics   Alcohol  use: Yes    Alcohol /week: 0.0 standard drinks of alcohol     Comment: Regular on weekends   Drug use: No     Allergies   Lipitor [atorvastatin  calcium ]   Review of Systems Review of Systems  Constitutional:  Positive for fever. Negative for chills.  HENT:  Positive for congestion. Negative for ear pain and sore throat.   Respiratory:  Positive for cough. Negative for shortness of breath.      Physical Exam Triage Vital Signs ED Triage Vitals [05/23/23 1255]  Encounter Vitals Group     BP 136/70     Systolic BP Percentile      Diastolic BP Percentile      Pulse Rate 77     Resp 18     Temp 98.6 F (37 C)     Temp src      SpO2 96 %     Weight      Height      Head Circumference      Peak Flow      Pain Score      Pain Loc      Pain Education      Exclude from Growth Chart    No data found.  Updated Vital Signs BP 136/70   Pulse 77   Temp 98.6 F (37 C)   Resp 18    SpO2 96%   Visual Acuity Right Eye Distance:   Left Eye Distance:   Bilateral Distance:    Right Eye Near:   Left Eye Near:    Bilateral Near:     Physical Exam Constitutional:      General: She is not in acute distress.  HENT:     Right Ear: Tympanic membrane normal.     Left Ear: Tympanic membrane normal.     Nose: Nose normal.     Mouth/Throat:     Mouth: Mucous membranes are moist.     Pharynx: Oropharynx is clear.     Comments: Clear PND. Cardiovascular:     Rate and Rhythm: Normal rate and regular rhythm.     Heart sounds: Normal heart sounds.  Pulmonary:     Effort: Pulmonary effort is normal. No respiratory distress.     Breath sounds: Normal breath sounds.  Neurological:     Mental Status: She is alert.      UC Treatments / Results  Labs (all labs ordered are listed, but only abnormal results are displayed) Labs Reviewed  POC COVID19/FLU A&B COMBO    EKG   Radiology No results found.  Procedures Procedures (including critical care time)  Medications Ordered in UC Medications - No data to display  Initial Impression / Assessment and Plan / UC Course  I have reviewed the triage vital signs and the nursing notes.  Pertinent labs & imaging results that were available during my care of the patient were reviewed by me and considered in my medical decision making (see chart for details).    Viral URI.  Lungs are clear and O2 sat 96%.  Rapid COVID and flu negative.  Treating cough with Tessalon  Perles.  Albuterol  inhaler also prescribed as patient reports her husband's expired albuterol  nebulizer treatment helped.  Discussed symptomatic treatment including Tylenol .  Instructed patient to follow-up with PCP if not improving.  ED precautions given.  Patient agrees to plan of care.   Final Clinical Impressions(s) / UC Diagnoses   Final diagnoses:  Viral URI     Discharge Instructions      The COVID and flu tests are negative.   Use the  albuterol  inhaler and take the Tessalon  as directed.    Follow-up with your primary care provider if your symptoms are not improving.      ED Prescriptions     Medication Sig Dispense Auth. Provider   albuterol  (VENTOLIN  HFA) 108 (90 Base) MCG/ACT inhaler Inhale 1-2 puffs into the lungs every 6 (six) hours as needed. 18 g Corlis Burnard DEL, NP   benzonatate  (TESSALON ) 100 MG capsule Take 1 capsule (100 mg total) by mouth 3 (three) times daily as needed for cough. 21 capsule Corlis Burnard DEL, NP      PDMP not reviewed this encounter.   Corlis Burnard DEL, NP 05/23/23 1320

## 2023-05-23 NOTE — ED Triage Notes (Signed)
 Patient to Urgent Care with complaints of fever/ cough/ nasal and chest congestion/ sore throat.   Reports symptoms started Thursday.   Taking ibuprofen/ Tylenol/ albuterol breathing treatments.

## 2023-06-29 ENCOUNTER — Encounter: Payer: Self-pay | Admitting: Family Medicine

## 2023-06-29 ENCOUNTER — Ambulatory Visit (INDEPENDENT_AMBULATORY_CARE_PROVIDER_SITE_OTHER): Payer: Medicare PPO | Admitting: Family Medicine

## 2023-06-29 VITALS — BP 134/84 | HR 75 | Temp 98.9°F | Wt 137.0 lb

## 2023-06-29 DIAGNOSIS — G5600 Carpal tunnel syndrome, unspecified upper limb: Secondary | ICD-10-CM | POA: Insufficient documentation

## 2023-06-29 DIAGNOSIS — R0981 Nasal congestion: Secondary | ICD-10-CM

## 2023-06-29 DIAGNOSIS — R059 Cough, unspecified: Secondary | ICD-10-CM | POA: Insufficient documentation

## 2023-06-29 DIAGNOSIS — G5603 Carpal tunnel syndrome, bilateral upper limbs: Secondary | ICD-10-CM | POA: Diagnosis not present

## 2023-06-29 DIAGNOSIS — R051 Acute cough: Secondary | ICD-10-CM | POA: Diagnosis not present

## 2023-06-29 MED ORDER — BENZONATATE 200 MG PO CAPS
200.0000 mg | ORAL_CAPSULE | Freq: Two times a day (BID) | ORAL | 0 refills | Status: AC | PRN
Start: 2023-06-29 — End: ?

## 2023-06-29 NOTE — Assessment & Plan Note (Addendum)
 Suspect postviral cough.  Discussed it could take 5 to 6 weeks for this to fully resolve.  If it does not continue to improve over the next 2 weeks she will let us know and we can order chest x-ray at that time.  Discussed at this point she does not need any antibiotics.  I will refill her Tessalon for her to use as prescribed for cough.

## 2023-06-29 NOTE — Progress Notes (Signed)
 Marikay Alar, MD Phone: [phone]  Robin Becker is a 73 y.o. female who presents today for same day visit.   Cough: Patient was seen on 05/23/2023 for viral upper respiratory infection.  Since then she notes progressively improving cough and progressively improving chest congestion.  Still occasionally coughs up some green mucus.  She feels better overall.  No fevers.  No shortness of breath.  Hand numbness and tingling: Patient notes for greater than 2 months has been having issues with the palmar aspect of her hands bilaterally having some numbness and tingling particularly at night when she is asleep or if she holds her phone for too long.  If she changes positions this improves.  She notes the left hand is worse than the right hand.  She notes no neck pain.  Notes a small brief headache a couple of days ago that lasted a few seconds and was very mild.  Patient does report a history of carpal tunnel syndrome.  Social History   Tobacco Use  Smoking Status Every Day   Current packs/day: 0.25   Types: Cigarettes  Smokeless Tobacco Never    Current Outpatient Medications on File Prior to Visit  Medication Sig Dispense Refill   Blood Glucose Monitoring Suppl (ACCU-CHEK AVIVA PLUS) w/Device KIT 1 Device by Does not apply route as needed. 1 kit 0   esomeprazole (NEXIUM) 20 MG capsule TAKE 1 CAPSULE BY MOUTH EVERY DAY AS NEEDED FOR HEARTBURN 90 capsule 1   glucose blood (ACCU-CHEK AVIVA) test strip Used to check blood sugars once a day. 50 each 12   Lancets (ACCU-CHEK SOFT TOUCH) lancets Use to check blood sugars once a day. 100 each 12   olmesartan (BENICAR) 20 MG tablet Take 0.5 tablets (10 mg total) by mouth daily. D/c 20 mg dose 90 tablet 1   albuterol (VENTOLIN HFA) 108 (90 Base) MCG/ACT inhaler Inhale 1-2 puffs into the lungs every 6 (six) hours as needed. (Patient not taking: Reported on 06/29/2023) 18 g 0   fexofenadine (ALLEGRA ALLERGY) 180 MG tablet Take 1 tablet (180 mg  total) by mouth at bedtime. Have patient review this medication with PCP soon as available.  It appears she has been taking it chronically at high doses 5 times normal.  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 (Patient not taking: Reported on 06/29/2023) 180 tablet 1   ibuprofen (ADVIL) 800 MG tablet Take 1 tablet (800 mg total) by mouth every 8 (eight) hours as needed. (Patient not taking: Reported on 06/29/2023) 30 tablet 0   tretinoin (RETIN-A) 0.025 % cream Apply a thin coat to dark spots on forehead and "blackheads" on the face QHS. (Patient not taking: Reported on 06/29/2023) 45 g 3   No current facility-administered medications on file prior to visit.     ROS see history of present illness  Objective  Physical Exam Vitals:   06/29/23 0809 06/29/23 0830  BP: 126/84 134/84  Pulse: 75   Temp: 98.9 F (37.2 C)   SpO2: 95%     BP Readings from Last 3 Encounters:  06/29/23 134/84  05/23/23 136/70  01/26/23 126/80   Wt Readings from Last 3 Encounters:  06/29/23 137 lb (62.1 kg)  05/21/23 137 lb (62.1 kg)  01/26/23 137 lb 9.6 oz (62.4 kg)    Physical Exam Constitutional:      General: She is not in acute distress.    Appearance: She is not diaphoretic.  Cardiovascular:     Rate and Rhythm:  Normal rate and regular rhythm.     Heart sounds: Normal heart sounds.  Pulmonary:     Effort: Pulmonary effort is normal.     Breath sounds: Normal breath sounds.  Musculoskeletal:     Comments: Negative Tinel's at the wrist and ulnar groove of the elbow, positive Phalen's bilateral wrist  Skin:    General: Skin is warm and dry.  Neurological:     Mental Status: She is alert.     Comments: 5/5 strength in bilateral biceps, triceps, grip, quads, hamstrings, plantar and dorsiflexion, sensation to light touch intact in bilateral UE and LE, normal gait      Assessment/Plan: Please see individual problem list.  Acute cough Assessment & Plan: Suspect postviral  cough.  Discussed it could take 5 to 6 weeks for this to fully resolve.  If it does not continue to improve over the next 2 weeks she will let us know and we can order chest x-ray at that time.  Discussed at this point she does not need any antibiotics.  I will refill her Tessalon for her to use as prescribed for cough.  Orders: -     Benzonatate; Take 1 capsule (200 mg total) by mouth 2 (two) times daily as needed for cough.  Dispense: 20 capsule; Refill: 0  Bilateral carpal tunnel syndrome Assessment & Plan: Symptoms and exam are consistent with carpal tunnel syndrome.  Discussed changing positions frequently.  Discussed wearing cock-up splints at night while asleep.  She reports she has these at home already.  If those are not helpful over the next couple of weeks she will let us know and we can refer to orthopedics.     Return in about 3 months (around 09/26/2023) for Transfer of care.   Marikay Alar, MD [facility]

## 2023-06-29 NOTE — Assessment & Plan Note (Signed)
 Symptoms and exam are consistent with carpal tunnel syndrome.  Discussed changing positions frequently.  Discussed wearing cock-up splints at night while asleep.  She reports she has these at home already.  If those are not helpful over the next couple of weeks she will let us know and we can refer to orthopedics.

## 2023-07-22 ENCOUNTER — Emergency Department
Admission: EM | Admit: 2023-07-22 | Discharge: 2023-07-22 | Disposition: A | Discharge status: Home / Self Care | Attending: Emergency Medicine | Admitting: Emergency Medicine

## 2023-07-22 ENCOUNTER — Other Ambulatory Visit: Payer: Self-pay

## 2023-07-22 ENCOUNTER — Emergency Department

## 2023-07-22 DIAGNOSIS — R519 Headache, unspecified: Secondary | ICD-10-CM | POA: Diagnosis present

## 2023-07-22 DIAGNOSIS — G43809 Other migraine, not intractable, without status migrainosus: Secondary | ICD-10-CM | POA: Diagnosis not present

## 2023-07-22 DIAGNOSIS — Z79899 Other long term (current) drug therapy: Secondary | ICD-10-CM | POA: Diagnosis not present

## 2023-07-22 DIAGNOSIS — M542 Cervicalgia: Secondary | ICD-10-CM | POA: Insufficient documentation

## 2023-07-22 DIAGNOSIS — R0789 Other chest pain: Secondary | ICD-10-CM | POA: Diagnosis not present

## 2023-07-22 DIAGNOSIS — I10 Essential (primary) hypertension: Secondary | ICD-10-CM | POA: Diagnosis not present

## 2023-07-22 DIAGNOSIS — G43819 Other migraine, intractable, without status migrainosus: Secondary | ICD-10-CM

## 2023-07-22 LAB — COMPREHENSIVE METABOLIC PANEL
ALT: 15 U/L (ref 0–44)
AST: 19 U/L (ref 15–41)
Albumin: 3.9 g/dL (ref 3.5–5.0)
Alkaline Phosphatase: 87 U/L (ref 38–126)
Anion gap: 8 (ref 5–15)
BUN: 16 mg/dL (ref 8–23)
CO2: 27 mmol/L (ref 22–32)
Calcium: 9.1 mg/dL (ref 8.9–10.3)
Chloride: 105 mmol/L (ref 98–111)
Creatinine, Ser: 0.64 mg/dL (ref 0.44–1.00)
GFR, Estimated: >60 mL/min (ref >60)
Glucose, Bld: 111 mg/dL — ABNORMAL HIGH (ref 70–99)
Potassium: 3.6 mmol/L (ref 3.5–5.1)
Sodium: 140 mmol/L (ref 135–145)
Total Bilirubin: 0.5 mg/dL (ref 0.0–1.2)
Total Protein: 7 g/dL (ref 6.5–8.1)

## 2023-07-22 LAB — CBC WITH DIFFERENTIAL/PLATELET
Abs Immature Granulocytes: 0.04 10*3/uL (ref 0.00–0.07)
Basophils Absolute: 0 10*3/uL (ref 0.0–0.1)
Basophils Relative: 0 %
Eosinophils Absolute: 0.1 10*3/uL (ref 0.0–0.5)
Eosinophils Relative: 1 %
HCT: 39.6 % (ref 36.0–46.0)
Hemoglobin: 12.9 g/dL (ref 12.0–15.0)
Immature Granulocytes: 0 %
Lymphocytes Relative: 20 %
Lymphs Abs: 2.5 10*3/uL (ref 0.7–4.0)
MCH: 27.5 pg (ref 26.0–34.0)
MCHC: 32.6 g/dL (ref 30.0–36.0)
MCV: 84.4 fL (ref 80.0–100.0)
Monocytes Absolute: 0.3 10*3/uL (ref 0.1–1.0)
Monocytes Relative: 2 %
Neutro Abs: 9.2 10*3/uL — ABNORMAL HIGH (ref 1.7–7.7)
Neutrophils Relative %: 77 %
Platelets: 264 10*3/uL (ref 150–400)
RBC: 4.69 MIL/uL (ref 3.87–5.11)
RDW: 14.4 % (ref 11.5–15.5)
WBC: 12 10*3/uL — ABNORMAL HIGH (ref 4.0–10.5)
nRBC: 0 % (ref 0.0–0.2)

## 2023-07-22 LAB — TROPONIN I (HIGH SENSITIVITY)
Troponin I (High Sensitivity): 6 ng/L (ref <18)
Troponin I (High Sensitivity): 6 ng/L (ref <18)

## 2023-07-22 MED ORDER — METOCLOPRAMIDE HCL 5 MG/ML IJ SOLN
10.0000 mg | Freq: Once | INTRAMUSCULAR | Status: AC
  Administered 2023-07-22: 10 mg via INTRAVENOUS
  Filled 2023-07-22: qty 2

## 2023-07-22 MED ORDER — DIPHENHYDRAMINE HCL 50 MG/ML IJ SOLN
12.5000 mg | Freq: Once | INTRAMUSCULAR | Status: AC
  Administered 2023-07-22: 12.5 mg via INTRAVENOUS
  Filled 2023-07-22: qty 1

## 2023-07-22 MED ORDER — ACETAMINOPHEN 500 MG PO TABS
1000.0000 mg | ORAL_TABLET | Freq: Once | ORAL | Status: AC
  Administered 2023-07-22: 1000 mg via ORAL
  Filled 2023-07-22: qty 2

## 2023-07-22 MED ORDER — DROPERIDOL 2.5 MG/ML IJ SOLN
1.2500 mg | Freq: Once | INTRAMUSCULAR | Status: AC
  Administered 2023-07-22: 1.25 mg via INTRAVENOUS
  Filled 2023-07-22: qty 2

## 2023-07-22 NOTE — Discharge Instructions (Addendum)
 Your workup was reassuring and there is no evidence of intercranial hemorrhage and no evidence of heart attack if you have return of your symptoms and without severe he should return to the ER for repeat evaluation or if you develop any other new or worsening symptoms

## 2023-07-22 NOTE — ED Triage Notes (Signed)
 Patient C/O sudden-onset headache, chest pain, and left shoulder pain that began about thirty minutes ago. Patient does have history of hypertension, no blood thinners.

## 2023-07-22 NOTE — ED Provider Notes (Addendum)
 [facility] Provider Note    Event Date/Time   First MD Initiated Contact with Patient 07/22/23 1920     (approximate)   History   Headache and Chest Pain   HPI  Robin Becker is a 73 y.o. female with history of hypertension who comes in with sudden onset of severe headache.  Patient reports that she just 45 minutes prior to arrival had severe headache and neck pain.  Denies ever having headaches previously.  She reported a little bit of intermittent chest pain but nothing too significant at this time.  She reports it is more of the headache that is bothering her.  Patient reports just feeling chilled all ove and shivering.  She denies the chest pain being radiating to the back, tearing sensation, numbness, tingling.  They do report some stress in the home and that she takes care of her bedridden husband and has a lot of stress from her daughter.  They do report that she did eat something a lot more salty today prior to this happening.  They deny any vision changes.   Physical Exam   Triage Vital Signs: ED Triage Vitals  Encounter Vitals Group     BP 07/22/23 1914 (!) 194/70     Systolic BP Percentile --      Diastolic BP Percentile --      Pulse Rate 07/22/23 1914 85     Resp 07/22/23 1914 18     Temp 07/22/23 1914 98.3 F (36.8 C)     Temp Source 07/22/23 1914 Oral     SpO2 07/22/23 1914 97 %     Weight 07/22/23 1916 137 lb (62.1 kg)     Height 07/22/23 1916 5\' 4"  (1.626 m)     Head Circumference --      Peak Flow --      Pain Score --      Pain Loc --      Pain Education --      Exclude from Growth Chart --     Most recent vital signs: Vitals:   07/22/23 1914  BP: (!) 194/70  Pulse: 85  Resp: 18  Temp: 98.3 F (36.8 C)  SpO2: 97%     General: Awake, no distress.  CV:  Good peripheral perfusion.  Resp:  Normal effort.  Abd:  No distention.  Soft nontender Other:  Cranial nerves II through XII are intact.  Equal strength in arms  and legs.  Equal radial pulses, equal DP pulses   ED Results / Procedures / Treatments   Labs (all labs ordered are listed, but only abnormal results are displayed) Labs Reviewed  CBC WITH DIFFERENTIAL/PLATELET - Abnormal; Notable for the following components:      Result Value   WBC 12.0 (*)    Neutro Abs 9.2 (*)    All other components within normal limits  COMPREHENSIVE METABOLIC PANEL - Abnormal; Notable for the following components:   Glucose, Bld 111 (*)    All other components within normal limits  TROPONIN I (HIGH SENSITIVITY)  TROPONIN I (HIGH SENSITIVITY)     EKG  My interpretation of EKG:  Sinus rate of 80s, no ST elevation or T wave inversions, normal intervals  RADIOLOGY I have reviewed the xray personally and interpreted and no evidence of intracranial hemorrhage   PROCEDURES:  Critical Care performed: No  Procedures   MEDICATIONS ORDERED IN ED: Medications - No data to display   IMPRESSION / MDM / ASSESSMENT  AND PLAN / ED COURSE  I reviewed the triage vital signs and the nursing notes.   Patient's presentation is most consistent with acute presentation with potential threat to life or bodily function.   Patient comes in with sudden onset of headache.  Patient within 6 hours therefore CT head would be very accurate for ruling out aneurysm.  CT head was negative for intracranial hemorrhage I did not call a stroke code given no neurodeficits.  Patient was given migraine cocktail.  She did report a little bit of vague intermittent chest pain.  I done a CT dissection on her previously and these have been negative and that she reports that she did not even have any chest pain when she got here.  My suspicion for dissection is very low at this point.  I will continue to reassess however  8:38 PM patient reports feeling better she just reports feeling more chilled.  Patient reports migraines come down.  She continues deny any chest pain at this time.  Blood  pressures have come down to the 150s- can give a dose of droperidol  9:55 PM patient had complete resolution of symptoms.  She appears much more calm.  She denies any headaches, chest pain.  She is a comfortable with discharge home with the plan to come back if symptoms return.  At this time it seems less likely a subarachnoid hemorrhage given she presented so fast and the CT head is negative this is very sensitive for ruling out intercranial hemorrhage and the fact that patient is had resolution of symptoms she feels comfortable with discharge home with plan to return if symptoms are returning.  After correction of her pain her blood pressure is now come down to normal.  They expressed understanding and felt comfortable with discharge home  Considered admission for patient but given resolution of symptoms and negative cardiac markers x 2 I think is reasonable for patient to go home  11:00 PM Re-evaluated and not pain still waiting on trop for dc.   The patient is on the cardiac monitor to evaluate for evidence of arrhythmia and/or significant heart rate changes.      FINAL CLINICAL IMPRESSION(S) / ED DIAGNOSES   Final diagnoses:  Other migraine without status migrainosus, intractable  Atypical chest pain     Rx / DC Orders   ED Discharge Orders     None        Note:  This document was prepared using Dragon voice recognition software and may include unintentional dictation errors.   Concha Se, MD 07/22/23 2158    Concha Se, MD 07/22/23 2300

## 2023-08-19 ENCOUNTER — Ambulatory Visit: Admitting: Nurse Practitioner

## 2023-09-18 ENCOUNTER — Ambulatory Visit
Admission: EM | Admit: 2023-09-18 | Discharge: 2023-09-18 | Disposition: A | Discharge status: Home / Self Care | Attending: Emergency Medicine | Admitting: Emergency Medicine

## 2023-09-18 ENCOUNTER — Encounter: Payer: Self-pay | Admitting: *Deleted

## 2023-09-18 DIAGNOSIS — H6501 Acute serous otitis media, right ear: Secondary | ICD-10-CM

## 2023-09-18 MED ORDER — AMOXICILLIN-POT CLAVULANATE 875-125 MG PO TABS: 1.0000 | ORAL_TABLET | Freq: Two times a day (BID) | ORAL | 0 refills | Status: DC

## 2023-09-18 NOTE — Discharge Instructions (Signed)
 Today you are being treated for an infection of the eardrum, is most likely the cause of your ear pain, ear itching and throat discomfort  Take Augmentin twice daily for 7 days, you should begin to see improvement after 48 hours of medication use and then it should progressively get better  You may use Tylenol  or ibuprofen  for management of discomfort  May hold warm compresses to the ear for additional comfort  Please not attempted any ear cleaning or object or fluid placement into the ear canal to prevent further irritation

## 2023-09-18 NOTE — ED Triage Notes (Signed)
 Patient states 3 weeks of right ear pain and itching no drainage, also right sided throat pain for same duration.

## 2023-09-18 NOTE — ED Provider Notes (Signed)
 Robin Becker    CSN: [id number] Arrival date & time: 09/18/23  0948      History   Chief Complaint Chief Complaint  Patient presents with   Otalgia   Sore Throat    HPI Robin Becker is a 73 y.o. female.   Patient presents for evaluation of right sided ear itching, pain and right sided neck pain beginning 2 weeks ago.  Pain in the ear has been reduced and is now radiating into the throat, feels internal.  Able to tolerate food and liquids.  No known sick contacts.  Has not attempted treatment.  Denies fever, nasal congestion, cough, ear drainage, decreased hearing.  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   Coronary artery calcification seen on CT scan 09/09/2021   Heart palpitations 09/09/2021   Premature atrial contractions 09/09/2021   Constipation 06/12/2021   Family history of colon cancer 09/13/2020   Depression, major, single episode, mild (HCC) 10/13/2017   Allergic dermatitis 09/29/2016   Essential hypertension 12/18/2015   OA (osteoarthritis) 12/18/2015   Prediabetes 12/18/2015   Routine general medical examination at a health care facility 05/22/2015   Hypopigmented skin lesion 12/02/2014   New onset of headaches after age 18    GERD (gastroesophageal reflux disease)    Seasonal allergies    HLD (hyperlipidemia)    Nicotine dependence, cigarettes, uncomplicated     Past Surgical History:  Procedure Laterality Date   CATARACT EXTRACTION W/PHACO Right 08/27/2015    Procedure: CATARACT EXTRACTION PHACO AND INTRAOCULAR LENS PLACEMENT (IOC);  Surgeon: Clair Crews, MD;  Location: [facility] ORS;  Service: Ophthalmology;  Laterality: Right;  US  00:53 AP% 20.1 CDE 10.80 fluid pack lot # [id number]   CESAREAN SECTION  05/12/1975   CHOLECYSTECTOMY  09/24/2021   COLONOSCOPY     COLONOSCOPY N/A 12/24/2021   Procedure: COLONOSCOPY;  Surgeon: Toledo, Alphonsus Jeans, MD;  Location: [facility] ENDOSCOPY;  Service: Gastroenterology;  Laterality: N/A;   ESOPHAGOGASTRODUODENOSCOPY N/A 12/24/2021   Procedure: ESOPHAGOGASTRODUODENOSCOPY (EGD);  Surgeon: Toledo, Alphonsus Jeans, MD;  Location: [facility] ENDOSCOPY;  Service: Gastroenterology;  Laterality: N/A;  PATIENT STATES THAT SHE DOES WANT UPPER ENDOSCOPY    OB History   No obstetric history on file.      Home Medications    Prior to Admission medications   Medication Sig Start Date End Date Taking? Authorizing Provider  amoxicillin-clavulanate (AUGMENTIN) 875-125 MG tablet Take 1 tablet by mouth every 12 (twelve) hours. 09/18/23  Yes Kyndel Egger R, NP  albuterol  (VENTOLIN  HFA) 108 (90 Base) MCG/ACT inhaler Inhale 1-2 puffs into the lungs every 6 (six) hours as needed. Patient not taking: Reported on 06/29/2023 05/23/23   Wellington Half, NP  benzonatate  (TESSALON ) 200 MG capsule Take 1 capsule (200 mg total) by mouth 2 (two) times daily as needed for cough. 06/29/23   Kent Pear, MD  Blood Glucose Monitoring Suppl (ACCU-CHEK  AVIVA PLUS) w/Device KIT 1 Device by Does not apply route as needed. 06/02/18   Kent Pear, MD  esomeprazole  (NEXIUM ) 20 MG capsule TAKE 1 CAPSULE BY MOUTH EVERY DAY AS NEEDED FOR HEARTBURN 02/19/23   Kent Pear, MD  fexofenadine  (ALLEGRA  ALLERGY) 180 MG tablet Take 1 tablet (180 mg total) by mouth at bedtime. Have patient review this medication with PCP soon as available.  It appears she has been taking it chronically at high doses 5 times normal.  And this may be indicated for chronic idiopathic  urticaria. Patient not taking: Reported on 06/29/2023 09/12/21   Flynn Hylan, MD  glucose blood (ACCU-CHEK AVIVA) test strip Used to check blood sugars once a day. 06/02/18   Kent Pear, MD  ibuprofen  (ADVIL ) 800 MG tablet Take 1 tablet (800 mg total) by mouth every 8 (eight) hours as needed. Patient not taking: Reported on 06/29/2023 09/12/21   Flynn Hylan, MD  Lancets (ACCU-CHEK SOFT [facility]) lancets Use to check blood sugars once a day. 06/02/18   Kent Pear, MD  olmesartan  (BENICAR ) 20 MG tablet Take 0.5 tablets (10 mg total) by mouth daily. D/c 20 mg dose 01/26/23   Kent Pear, MD  tretinoin  (RETIN-A ) 0.025 % cream Apply a thin coat to dark spots on forehead and "blackheads" on the face QHS. Patient not taking: Reported on 06/29/2023 06/17/22   Elta Halter, MD    Family History Family History  Problem Relation Age of Onset   Heart disease Mother    Stroke Mother    Cancer Sister 1       colon and otherwise   Stroke Maternal Aunt    Coronary artery disease Cousin    Diabetes Neg Hx    Breast cancer Neg Hx     Social History Social History   Tobacco Use   Smoking status: Every Day    Current packs/day: 0.25    Types: Cigarettes   Smokeless tobacco: Never  Vaping Use   Vaping status: Never Used  Substance Use Topics   Alcohol  use: Yes    Alcohol /week: 0.0 standard drinks of alcohol     Comment: Regular on weekends   Drug use: No     Allergies   Lipitor [atorvastatin  calcium ]   Review of Systems Review of Systems   Physical Exam Triage Vital Signs ED Triage Vitals  Encounter Vitals Group     BP 09/18/23 1022 (!) 158/72     Systolic BP Percentile --      Diastolic BP Percentile --      Pulse Rate 09/18/23 1022 72     Resp 09/18/23 1022 18     Temp 09/18/23 1022 98.2 F (36.8 C)     Temp Source 09/18/23 1022 Oral     SpO2 09/18/23 1022 96 %     Weight 09/18/23 1018 135 lb (61.2 kg)     Height 09/18/23 1018 5\' 4"  (1.626 m)      Head Circumference --      Peak Flow --      Pain Score 09/18/23 1018 2     Pain Loc --      Pain Education --      Exclude from Growth Chart --    No data found.  Updated Vital Signs BP (!) 158/72 (BP Location: Left Arm) Comment: checked twice, hasn't taken BP med today  Pulse 72   Temp 98.2 F (36.8 C) (Oral)   Resp 18  Ht 5\' 4"  (1.626 m)   Wt 135 lb (61.2 kg)   SpO2 96%   BMI 23.17 kg/m   Visual Acuity Right Eye Distance:   Left Eye Distance:   Bilateral Distance:    Right Eye Near:   Left Eye Near:    Bilateral Near:     Physical Exam Constitutional:      Appearance: Normal appearance.  HENT:     Head: Normocephalic.     Right Ear: Ear canal and external ear normal. Tympanic membrane is erythematous.     Left Ear: Tympanic membrane, ear canal and external ear normal.     Nose: Nose normal.     Mouth/Throat:     Pharynx: Posterior oropharyngeal erythema present. No oropharyngeal exudate.  Eyes:     Extraocular Movements: Extraocular movements intact.  Pulmonary:     Effort: Pulmonary effort is normal.  Musculoskeletal:     Cervical back: Normal range of motion and neck supple.  Neurological:     Mental Status: She is alert and oriented to person, place, and time. Mental status is at baseline.      UC Treatments / Results  Labs (all labs ordered are listed, but only abnormal results are displayed) Labs Reviewed - No data to display  EKG   Radiology No results found.  Procedures Procedures (including critical care time)  Medications Ordered in UC Medications - No data to display  Initial Impression / Assessment and Plan / UC Course  I have reviewed the triage vital signs and the nursing notes.  Pertinent labs & imaging results that were available during my care of the patient were reviewed by me and considered in my medical decision making (see chart for details).  Nonrecurrent acute serous otitis media of right ear  Erythema to the  tympanic membrane is consistent with infection, congestion to the nasal turbinates otherwise stable exam,  prescribed augmentin, advised against ear cleaning, may use over-the-counter analgesics and warm compresses to the external ear for comfort, may follow-up if symptoms persist worsen or recur  Final Clinical Impressions(s) / UC Diagnoses   Final diagnoses:  Non-recurrent acute serous otitis media of right ear   Discharge Instructions      Today you are being treated for an infection of the eardrum, is most likely the cause of your ear pain, ear itching and throat discomfort  Take Augmentin twice daily for 7 days, you should begin to see improvement after 48 hours of medication use and then it should progressively get better  You may use Tylenol  or ibuprofen  for management of discomfort  May hold warm compresses to the ear for additional comfort  Please not attempted any ear cleaning or object or fluid placement into the ear canal to prevent further irritation   ED Prescriptions     Medication Sig Dispense Auth. Provider   amoxicillin-clavulanate (AUGMENTIN) 875-125 MG tablet Take 1 tablet by mouth every 12 (twelve) hours. 14 tablet Thorsten Climer R, NP      PDMP not reviewed this encounter.   Reena Canning, NP 09/18/23 1035

## 2024-03-15 ENCOUNTER — Encounter

## 2024-03-15 DIAGNOSIS — E785 Hyperlipidemia, unspecified: Secondary | ICD-10-CM

## 2024-03-15 DIAGNOSIS — R7303 Prediabetes: Secondary | ICD-10-CM

## 2024-03-15 DIAGNOSIS — I10 Essential (primary) hypertension: Secondary | ICD-10-CM

## 2024-04-10 ENCOUNTER — Ambulatory Visit

## 2024-04-10 ENCOUNTER — Encounter: Payer: Self-pay | Admitting: Internal Medicine

## 2024-04-10 ENCOUNTER — Ambulatory Visit: Admitting: Internal Medicine

## 2024-04-10 VITALS — BP 132/80 | HR 68 | Temp 98.1°F | Ht 64.0 in | Wt 133.8 lb

## 2024-04-10 DIAGNOSIS — J0100 Acute maxillary sinusitis, unspecified: Secondary | ICD-10-CM

## 2024-04-10 DIAGNOSIS — R7303 Prediabetes: Secondary | ICD-10-CM

## 2024-04-10 DIAGNOSIS — R051 Acute cough: Secondary | ICD-10-CM

## 2024-04-10 DIAGNOSIS — L501 Idiopathic urticaria: Secondary | ICD-10-CM

## 2024-04-10 DIAGNOSIS — I10 Essential (primary) hypertension: Secondary | ICD-10-CM | POA: Diagnosis not present

## 2024-04-10 DIAGNOSIS — E785 Hyperlipidemia, unspecified: Secondary | ICD-10-CM

## 2024-04-10 LAB — POC COVID19 BINAXNOW: SARS Coronavirus 2 Ag: NEGATIVE

## 2024-04-10 LAB — POCT INFLUENZA A/B
Influenza A, POC: NEGATIVE
Influenza B, POC: NEGATIVE

## 2024-04-10 MED ORDER — AMOXICILLIN-POT CLAVULANATE 875-125 MG PO TABS: 1.0000 | ORAL_TABLET | Freq: Two times a day (BID) | ORAL | 0 refills | Status: AC

## 2024-04-10 MED ORDER — FEXOFENADINE HCL 180 MG PO TABS: 180.0000 mg | ORAL_TABLET | Freq: Every day | ORAL | 1 refills | Status: AC

## 2024-04-10 NOTE — Assessment & Plan Note (Signed)
-   Patient has a persistent cough productive of whitish phlegm associated with her acute sinusitis -I suspect this is likely secondary to a postviral cough -However, given the persistence of her symptoms will obtain a chest x-ray to rule out an underlying infection -Will continue with fexofenadine  in case there is an allergic component to her cough -Patient is also on Augmentin  for her acute sinusitis but this should also cover a bacterial infection if she has it but this is unlikely -No further workup at this time

## 2024-04-10 NOTE — Assessment & Plan Note (Signed)
-   Patient complains of acute sinus congestion over the last 2 weeks after being exposed to a nephew who had the flu -Flu and COVID test here are negative -She complains of persistent sinus congestion and heaviness over her frontal and maxillary sinuses -On exam, no sinus tenderness present -She also complains of an associated cough with sputum production (white) -Will treat the patient with Augmentin  to complete a 10-day course -Continue with Mucinex, saline nasal rinses, warm showers with steam inhalation and Nettie pot -No further workup at this time

## 2024-04-10 NOTE — Patient Instructions (Addendum)
  VISIT SUMMARY: Today, you were seen for sinus congestion and cough that have been ongoing for two weeks. We discussed your symptoms, including the congestion and cough, and reviewed your current management strategies. We also talked about your blood pressure management and gastrointestinal symptoms.  YOUR PLAN: -ACUTE SINUSITIS: Acute sinusitis is an inflammation of the sinuses often caused by a viral or bacterial infection. You have been experiencing sinus congestion, cough, and eye pain for two weeks. Continue using Mucinex for your cough and phlegm, and try warm showers with steam, lemon, and honey for relief. You may also use a saline nasal rinse or neti pot for congestion.  I have prescribed Augmentin  to use if symptoms worsen.  Please take this with food  Acute cough: A chest x-ray has been ordered to rule out pneumonia.  Continue Mucinex for cough.  I have also prescribed Allegra  in case there is an allergic component to this cough  -ESSENTIAL HYPERTENSION: Essential hypertension is high blood pressure with no identifiable cause. No changes to your current management strategy are needed at this time.  - We will be checking some routine blood work on you as well including kidney and liver function as well as A1c and cholesterol panel  INSTRUCTIONS: Please follow up with us  after your chest x-ray results are available. Continue monitoring your blood pressure and take your medication as needed. If your sinus symptoms worsen, start taking the prescribed Augmentin  with food.                      Contains text generated by [employer].                                 Contains text generated by [employer]bridge.

## 2024-04-10 NOTE — Assessment & Plan Note (Signed)
-   This problem is chronic and stable -Pain  is not on the statin currently -Will recheck lipid panel today -No further workup at this time

## 2024-04-10 NOTE — Progress Notes (Signed)
 Acute Office Visit  Subjective:     Patient ID: Robin Becker, female    DOB: [birth year], 73 y.o.   MRN: [id number]  Chief Complaint  Patient presents with   Acute Visit    Cough, congestion and sinus pressure x 2 weeks White mucous   Discussed the use of AI scribe software for clinical note transcription with the patient, who gave verbal consent to proceed.  History of Present Illness Robin Becker is a 73 year old female who presents with two weeks of sinus congestion and cough.  Upper respiratory symptoms - Two weeks of persistent sinus congestion and cough following exposure to a family member with influenza - Significant sinus congestion and chest congestion with production of white phlegm - No fever, fatigue, or shortness of breath - Received influenza vaccination prior to symptom onset  Symptom management - Uses Mucinex at night for cough with perceived benefit - Utilizes home remedies including hot water with lemon and honey, and warm showers with steam for symptom relief - Tried saline nasal spray but discontinued due to excessive nasal dryness  Hypertension - Monitors blood pressure regularly - Takes antihypertensive medication only when blood pressure exceeds 140/80 mmHg  Gastrointestinal symptoms - Uses Nexium  approximately once every two weeks for gastrointestinal discomfort  Appetite and weight loss - Significant weight loss over the past six years attributed to caregiving stress - Decreased appetite but maintains adequate nutrition     Review of Systems  Constitutional: Negative.  Negative for chills, fever and malaise/fatigue.  HENT:  Positive for congestion. Negative for sinus pain.   Respiratory:  Positive for cough and sputum production. Negative for shortness of breath.   Cardiovascular: Negative.   Gastrointestinal: Negative.   Musculoskeletal: Negative.   Neurological: Negative.   Psychiatric/Behavioral: Negative.          Objective:     BP 132/80   Pulse 68   Temp 98.1 F (36.7 C)   Ht 5' 4 (1.626 m)   Wt 133 lb 12.8 oz (60.7 kg)   SpO2 97%   BMI 22.97 kg/m    Physical Exam Constitutional:      Appearance: Normal appearance.  HENT:     Head: Normocephalic and atraumatic.     Right Ear: Tympanic membrane, ear canal and external ear normal.     Left Ear: Tympanic membrane, ear canal and external ear normal.     Nose:     Right Sinus: No maxillary sinus tenderness or frontal sinus tenderness.     Left Sinus: No maxillary sinus tenderness or frontal sinus tenderness.     Mouth/Throat:     Mouth: Mucous membranes are moist.     Pharynx: Oropharynx is clear. No oropharyngeal exudate or posterior oropharyngeal erythema.  Cardiovascular:     Rate and Rhythm: Normal rate and regular rhythm.  Pulmonary:     Effort: Pulmonary effort is normal.     Breath sounds: Normal breath sounds. No wheezing, rhonchi or rales.  Abdominal:     General: Bowel sounds are normal. There is no distension.     Palpations: Abdomen is soft.     Tenderness: There is no abdominal tenderness.  Musculoskeletal:        General: No swelling or tenderness.     Cervical back: Neck supple.     Right lower leg: No edema.     Left lower leg: No edema.  Lymphadenopathy:     Cervical: No cervical adenopathy.  Neurological:  Mental Status: She is alert.  Psychiatric:        Mood and Affect: Mood normal.     Results for orders placed or performed in visit on 04/10/24  POC COVID-19 BinaxNow  Result Value Ref Range   SARS Coronavirus 2 Ag Negative Negative  POCT Influenza A/B  Result Value Ref Range   Influenza A, POC Negative Negative   Influenza B, POC Negative Negative        Assessment & Plan:   Problem List Items Addressed This Visit       Cardiovascular and Mediastinum   Essential hypertension (Chronic)   - This problem is chronic and stable -Patient takes olmesartan  only if her systolic blood pressure is over 140.   She states that she takes it otherwise her blood pressure goes too low and she feels lightheaded -Currently, her blood pressure is at goal off medications (132/80) -Will check a BMP today -Continue with current medication regimen -No further workup at this time      Relevant Orders   Comprehensive metabolic panel with GFR     Respiratory   Acute sinusitis   - Patient complains of acute sinus congestion over the last 2 weeks after being exposed to a nephew who had the flu -Flu and COVID test here are negative -She complains of persistent sinus congestion and heaviness over her frontal and maxillary sinuses -On exam, no sinus tenderness present -She also complains of an associated cough with sputum production (white) -Will treat the patient with Augmentin  to complete a 10-day course -Continue with Mucinex, saline nasal rinses, warm showers with steam inhalation and Nettie pot -No further workup at this time      Relevant Medications   amoxicillin -clavulanate (AUGMENTIN ) 875-125 MG tablet   fexofenadine  (ALLEGRA  ALLERGY) 180 MG tablet     Other   HLD (hyperlipidemia) (Chronic)   - This problem is chronic and stable -Pain  is not on the statin currently -Will recheck lipid panel today -No further workup at this time      Relevant Orders   Lipid panel   Cough - Primary   - Patient has a persistent cough productive of whitish phlegm associated with her acute sinusitis -I suspect this is likely secondary to a postviral cough -However, given the persistence of her symptoms will obtain a chest x-ray to rule out an underlying infection -Will continue with fexofenadine  in case there is an allergic component to her cough -Patient is also on Augmentin  for her acute sinusitis but this should also cover a bacterial infection if she has it but this is unlikely -No further workup at this time      Relevant Orders   POC COVID-19 BinaxNow (Completed)   POCT Influenza A/B (Completed)   DG  Chest 2 View   Prediabetes   - Patient has a history of prediabetes -Her last A1c was 5.9 last year -Recheck an A1c today -No further workup at this time      Relevant Orders   Hemoglobin A1c   Other Visit Diagnoses       Chronic idiopathic urticaria       Relevant Medications   fexofenadine  (ALLEGRA  ALLERGY) 180 MG tablet       Meds ordered this encounter  Medications   amoxicillin -clavulanate (AUGMENTIN ) 875-125 MG tablet    Sig: Take 1 tablet by mouth 2 (two) times daily.    Dispense:  20 tablet    Refill:  0   fexofenadine  (ALLEGRA  ALLERGY) 180  MG tablet    Sig: Take 1 tablet (180 mg total) by mouth at bedtime. Have patient review this medication with PCP soon as available.  It appears she has been taking it chronically at high doses 5 times normal.  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    Dispense:  180 tablet    Refill:  1    No follow-ups on file.  Deasia Chiu, MD

## 2024-04-10 NOTE — Assessment & Plan Note (Addendum)
-   This problem is chronic and stable -Patient takes olmesartan  only if her systolic blood pressure is over 140.  She states that she takes it otherwise her blood pressure goes too low and she feels lightheaded -Currently, her blood pressure is at goal off medications (132/80) -Will check a BMP today -Continue with current medication regimen -No further workup at this time

## 2024-04-10 NOTE — Assessment & Plan Note (Signed)
-   Patient has a history of prediabetes -Her last A1c was 5.9 last year -Recheck an A1c today -No further workup at this time

## 2024-04-11 LAB — COMPREHENSIVE METABOLIC PANEL WITH GFR
ALT: 10 U/L (ref 0–35)
AST: 16 U/L (ref 0–37)
Albumin: 4 g/dL (ref 3.5–5.2)
Alkaline Phosphatase: 90 U/L (ref 39–117)
BUN: 13 mg/dL (ref 6–23)
CO2: 29 meq/L (ref 19–32)
Calcium: 9.2 mg/dL (ref 8.4–10.5)
Chloride: 105 meq/L (ref 96–112)
Creatinine, Ser: 0.66 mg/dL (ref 0.40–1.20)
GFR: 85.93 mL/min (ref >60.00)
Glucose, Bld: 86 mg/dL (ref 70–99)
Potassium: 3.9 meq/L (ref 3.5–5.1)
Sodium: 141 meq/L (ref 135–145)
Total Bilirubin: 0.3 mg/dL (ref 0.2–1.2)
Total Protein: 6.8 g/dL (ref 6.0–8.3)

## 2024-04-11 LAB — LIPID PANEL
Cholesterol: 208 mg/dL — ABNORMAL HIGH (ref 0–200)
HDL: 49.3 mg/dL (ref >39.00)
LDL Cholesterol: 117 mg/dL — ABNORMAL HIGH (ref 0–99)
NonHDL: 158.5
Total CHOL/HDL Ratio: 4
Triglycerides: 207 mg/dL — ABNORMAL HIGH (ref 0.0–149.0)
VLDL: 41.4 mg/dL — ABNORMAL HIGH (ref 0.0–40.0)

## 2024-04-11 LAB — HEMOGLOBIN A1C: Hgb A1c MFr Bld: 6 % (ref 4.6–6.5)

## 2024-04-12 ENCOUNTER — Ambulatory Visit: Payer: Self-pay | Admitting: Internal Medicine

## 2024-05-22 ENCOUNTER — Ambulatory Visit: Payer: Medicare HMO | Admitting: *Deleted

## 2024-05-22 VITALS — BP 131/81 | Ht 64.0 in | Wt 131.5 lb

## 2024-05-22 DIAGNOSIS — Z0000 Encounter for general adult medical examination without abnormal findings: Secondary | ICD-10-CM

## 2024-05-22 NOTE — Patient Instructions (Signed)
 [name],  Thank you for taking the time for your Medicare Wellness Visit. I appreciate your continued commitment to your health goals. Please review the care plan we discussed, and feel free to reach out if I can assist you further.  Please note that Annual Wellness Visits do not include a physical exam. Some assessments may be limited, especially if the visit was conducted virtually. If needed, we may recommend an in-person follow-up with your provider.  Ongoing Care Seeing your primary care provider every 3 to 6 months helps us  monitor your health and provide consistent, personalized care.  Consider updating your vaccines.  Referrals If a referral was made during today's visit and you haven't received any updates within two weeks, please contact the referred provider directly to check on the status.  Recommended Screenings:  Health Maintenance  Topic Date Due   Zoster (Shingles) Vaccine (1 of 2) Never done   COVID-19 Vaccine (5 - 2025-26 season) 01/10/2024   Flu Shot  08/08/2024*   Medicare Annual Wellness Visit  05/22/2025   DTaP/Tdap/Td vaccine (3 - Td or Tdap) 03/30/2026   Colon Cancer Screening  12/25/2031   Pneumococcal Vaccine for age over 40  Completed   Osteoporosis screening with Bone Density Scan  Completed   Hepatitis C Screening  Completed   Meningitis B Vaccine  Aged Out   Breast Cancer Screening  Discontinued  *Topic was postponed. The date shown is not the original due date.       05/22/2024   10:24 AM  Advanced Directives  Does Patient Have a Medical Advance Directive? No    Vision: Annual vision screenings are recommended for early detection of glaucoma, cataracts, and diabetic retinopathy. These exams can also reveal signs of chronic conditions such as diabetes and high blood pressure.  Dental: Annual dental screenings help detect early signs of oral cancer, gum disease, and other conditions linked to overall health, including heart disease and  diabetes.  Please see the attached documents for additional preventive care recommendations.

## 2024-05-22 NOTE — Progress Notes (Signed)
 "  Chief Complaint  Patient presents with   Medicare Wellness     Subjective:   Nadalee Neiswender is a 74 y.o. female who presents for a Medicare Annual Wellness Visit.  Visit info / Clinical Intake: Medicare Wellness Visit Type:: Subsequent Annual Wellness Visit Persons participating in visit and providing information:: patient Medicare Wellness Visit Mode:: Telephone If telephone:: video declined Since this visit was completed virtually, some vitals may be partially provided or unavailable. Missing vitals are due to the limitations of the virtual format.: Documented vitals are patient reported If Telephone or Video please confirm:: I connected with patient using audio/video enable telemedicine. I verified patient identity with two identifiers, discussed telehealth limitations, and patient agreed to proceed. Patient Location:: Home Provider Location:: Office/Home Interpreter Needed?: No Pre-visit prep was completed: yes AWV questionnaire completed by patient prior to visit?: no Living arrangements:: with family/others Patient's Overall Health Status Rating: very good Typical amount of pain: none Does pain affect daily life?: no Are you currently prescribed opioids?: no  Dietary Habits and Nutritional Risks How many meals a day?: 2 Eats fruit and vegetables daily?: yes Most meals are obtained by: preparing own meals In the last 2 weeks, have you had any of the following?: none Diabetic:: no  Functional Status Activities of Daily Living (to include ambulation/medication): Independent Ambulation: Independent Medication Administration: Independent Home Management (perform basic housework or laundry): Independent Manage your own finances?: yes Primary transportation is: driving Concerns about vision?: no *vision screening is required for WTM* Concerns about hearing?: no  Fall Screening Falls in the past year?: 0 Number of falls in past year: 0 Was there an injury with Fall?:  0 Fall Risk Category Calculator: 0 Patient Fall Risk Level: Low Fall Risk  Fall Risk Patient at Risk for Falls Due to: No Fall Risks Fall risk Follow up: Falls evaluation completed; Falls prevention discussed  Home and Transportation Safety: All rugs have non-skid backing?: N/A, no rugs All stairs or steps have railings?: N/A, no stairs Grab bars in the bathtub or shower?: (!) no Have non-skid surface in bathtub or shower?: (!) no Good home lighting?: yes Regular seat belt use?: yes Hospital stays in the last year:: no  Cognitive Assessment Difficulty concentrating, remembering, or making decisions? : no Will 6CIT or Mini Cog be Completed: yes What year is it?: 0 points What month is it?: 0 points Give patient an address phrase to remember (5 components): [address] [place] About what time is it?: 0 points Count backwards from 20 to 1: 0 points Say the months of the year in reverse: 0 points Repeat the address phrase from earlier: 2 points 6 CIT Score: 2 points  Advance Directives (For Healthcare) Does Patient Have a Medical Advance Directive?: No Would patient like information on creating a medical advance directive?: No - Patient declined  Reviewed/Updated  Reviewed/Updated: Reviewed All (Medical, Surgical, Family, Medications, Allergies, Care Teams, Patient Goals)    Allergies (verified) Lipitor [atorvastatin  calcium ]   Current Medications (verified) Outpatient Encounter Medications as of 05/22/2024  Medication Sig   esomeprazole  (NEXIUM ) 20 MG capsule TAKE 1 CAPSULE BY MOUTH EVERY DAY AS NEEDED FOR HEARTBURN   fexofenadine  (ALLEGRA  ALLERGY) 180 MG tablet Take 1 tablet (180 mg total) by mouth at bedtime. Have patient review this medication with PCP soon as available.  It appears she has been taking it chronically at high doses 5 times normal.  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   olmesartan  (BENICAR )  20 MG tablet Take 0.5 tablets (10 mg  total) by mouth daily. D/c 20 mg dose   albuterol  (VENTOLIN  HFA) 108 (90 Base) MCG/ACT inhaler Inhale 1-2 puffs into the lungs every 6 (six) hours as needed. (Patient not taking: Reported on 05/22/2024)   amoxicillin -clavulanate (AUGMENTIN ) 875-125 MG tablet Take 1 tablet by mouth 2 (two) times daily. (Patient not taking: Reported on 05/22/2024)   benzonatate  (TESSALON ) 200 MG capsule Take 1 capsule (200 mg total) by mouth 2 (two) times daily as needed for cough. (Patient not taking: Reported on 05/22/2024)   Blood Glucose Monitoring Suppl (ACCU-CHEK AVIVA PLUS) w/Device KIT 1 Device by Does not apply route as needed. (Patient not taking: Reported on 05/22/2024)   glucose blood (ACCU-CHEK AVIVA) test strip Used to check blood sugars once a day. (Patient not taking: Reported on 05/22/2024)   Lancets (ACCU-CHEK SOFT TOUCH) lancets Use to check blood sugars once a day. (Patient not taking: Reported on 05/22/2024)   No facility-administered encounter medications on file as of 05/22/2024.    History: Past Medical History:  Diagnosis Date   Allergy    Seasonal   Chicken pox    Depression    GERD (gastroesophageal reflux disease)    Glaucoma    s/p surgery   History of chicken pox    History of pneumonia 2013   [facility]   Hypertension    Prediabetes 2011   h/o A1c 6.5%   Seasonal allergies    Smoking    Past Surgical History:  Procedure Laterality Date   CATARACT EXTRACTION W/PHACO Right 08/27/2015   Procedure: CATARACT EXTRACTION PHACO AND INTRAOCULAR LENS PLACEMENT (IOC);  Surgeon: Elsie Carmine, MD;  Location: [facility] ORS;  Service: Ophthalmology;  Laterality: Right;  US  00:53 AP% 20.1 CDE 10.80 fluid pack lot # [id number]   CESAREAN SECTION  05/12/1975   CHOLECYSTECTOMY  09/24/2021   COLONOSCOPY     COLONOSCOPY N/A 12/24/2021   Procedure: COLONOSCOPY;  Surgeon: Toledo, Ladell POUR, MD;  Location: [facility] ENDOSCOPY;  Service: Gastroenterology;  Laterality: N/A;   ESOPHAGOGASTRODUODENOSCOPY N/A  12/24/2021   Procedure: ESOPHAGOGASTRODUODENOSCOPY (EGD);  Surgeon: Toledo, Ladell POUR, MD;  Location: [facility] ENDOSCOPY;  Service: Gastroenterology;  Laterality: N/A;  PATIENT STATES THAT SHE DOES WANT UPPER ENDOSCOPY   Family History  Problem Relation Age of Onset   Heart disease Mother    Stroke Mother    Cancer Sister 61       colon and otherwise   Stroke Maternal Aunt    Coronary artery disease Cousin    Diabetes Neg Hx    Breast cancer Neg Hx    Social History   Occupational History   Not on file  Tobacco Use   Smoking status: Every Day    Current packs/day: 0.25    Types: Cigarettes   Smokeless tobacco: Never  Vaping Use   Vaping status: Never Used  Substance and Sexual Activity   Alcohol  use: Yes    Alcohol /week: 0.0 standard drinks of alcohol     Comment: Regular on weekends   Drug use: No   Sexual activity: Not on file   Tobacco Counseling Ready to quit: Yes Counseling given: Yes  SDOH Screenings   Food Insecurity: No Food Insecurity (05/22/2024)  Housing: Low Risk (05/22/2024)  Transportation Needs: No Transportation Needs (05/22/2024)  Utilities: Not At Risk (05/22/2024)  Alcohol  Screen: Low Risk (05/22/2024)  Depression (PHQ2-9): Low Risk (05/22/2024)  Financial Resource Strain: Low Risk (05/22/2024)  Physical Activity: Inactive (05/22/2024)  Social Connections: Socially  Isolated (05/22/2024)  Stress: No Stress Concern Present (05/22/2024)  Tobacco Use: High Risk (05/22/2024)  Health Literacy: Adequate Health Literacy (05/22/2024)   See flowsheets for full screening details  Depression Screen PHQ 2 & 9 Depression Scale- Over the past 2 weeks, how often have you been bothered by any of the following problems? Little interest or pleasure in doing things: 0 Feeling down, depressed, or hopeless (PHQ Adolescent also includes...irritable): 0 PHQ-2 Total Score: 0 Trouble falling or staying asleep, or sleeping too much: 0 Feeling tired or having little energy: 0 Poor  appetite or overeating (PHQ Adolescent also includes...weight loss): 0 Feeling bad about yourself - or that you are a failure or have let yourself or your family down: 0 Trouble concentrating on things, such as reading the newspaper or watching television (PHQ Adolescent also includes...like school work): 0 Moving or speaking so slowly that other people could have noticed. Or the opposite - being so fidgety or restless that you have been moving around a lot more than usual: 0 Thoughts that you would be better off dead, or of hurting yourself in some way: 0 PHQ-9 Total Score: 0 If you checked off any problems, how difficult have these problems made it for you to do your work, take care of things at home, or get along with other people?: Not difficult at all     Goals Addressed             This Visit's Progress    Patient Stated       Wants to continue to eat healthy             Objective:    Today's Vitals   05/22/24 1016  BP: 131/81  Weight: 131 lb 8 oz (59.6 kg)  Height: 5' 4 (1.626 m)   Body mass index is 22.57 kg/m.  Hearing/Vision screen Hearing Screening - Comments:: No issues Vision Screening - Comments:: Glasses, [employer],  up to date Immunizations and Health Maintenance Health Maintenance  Topic Date Due   Zoster Vaccines- Shingrix (1 of 2) Never done   COVID-19 Vaccine (5 - 2025-26 season) 01/10/2024   Influenza Vaccine  08/08/2024 (Originally 12/10/2023)   Medicare Annual Wellness (AWV)  05/22/2025   DTaP/Tdap/Td (3 - Td or Tdap) 03/30/2026   Colonoscopy  12/25/2031   Pneumococcal Vaccine: 50+ Years  Completed   Bone Density Scan  Completed   Hepatitis C Screening  Completed   Meningococcal B Vaccine  Aged Out   Mammogram  Discontinued        Assessment/Plan:  This is a routine wellness examination for [place].  Patient Care Team: Toledo, Teodoro K, MD as Consulting Physician (Gastroenterology) Hester Alm BROCKS, MD (Dermatology) Ammon Blunt, MD as Consulting Physician (Cardiology)  I have personally reviewed and noted the following in the patients chart:   Medical and social history Use of alcohol , tobacco or illicit drugs  Current medications and supplements including opioid prescriptions. Functional ability and status Nutritional status Physical activity Advanced directives List of other physicians Hospitalizations, surgeries, and ER visits in previous 12 months Vitals Screenings to include cognitive, depression, and falls Referrals and appointments  No orders of the defined types were placed in this encounter.  In addition, I have reviewed and discussed with patient certain preventive protocols, quality metrics, and best practice recommendations. A written personalized care plan for preventive services as well as general preventive health recommendations were provided to patient.   Angeline Fredericks, [place]   8/87/7973  Return in 1 year (on 05/22/2025).  After Visit Summary: (Pick Up) Due to this being a telephonic visit, with patients personalized plan was offered to patient and patient has requested to Pick up at office.  Nurse Notes: Patient declines Dexa scan, covid and shingles vaccines.  "

## 2024-07-27 ENCOUNTER — Encounter: Admitting: Nurse Practitioner

## 2025-05-28 ENCOUNTER — Ambulatory Visit
